# Patient Record
Sex: Male | Born: 1995 | Race: White | Hispanic: No | Marital: Single | Smoking: Never smoker
Health system: Southern US, Community
[De-identification: ages and names within clinical notes are randomized; demographics above are authoritative.]

## PROBLEM LIST (undated history)

## (undated) DIAGNOSIS — G809 Cerebral palsy, unspecified: Secondary | ICD-10-CM

## (undated) DIAGNOSIS — IMO0001 Reserved for inherently not codable concepts without codable children: Secondary | ICD-10-CM

## (undated) DIAGNOSIS — J45909 Unspecified asthma, uncomplicated: Secondary | ICD-10-CM

## (undated) DIAGNOSIS — R569 Unspecified convulsions: Secondary | ICD-10-CM

## (undated) DIAGNOSIS — F8189 Other developmental disorders of scholastic skills: Secondary | ICD-10-CM

## (undated) HISTORY — PX: TONSILLECTOMY: SUR1361

## (undated) HISTORY — PX: ADENOIDECTOMY: SUR15

## (undated) HISTORY — PX: ROOT CANAL: SHX2363

## (undated) HISTORY — PX: TYMPANOSTOMY TUBE PLACEMENT: SHX32

## (undated) HISTORY — PX: ESOPHAGOGASTRODUODENOSCOPY: SHX1529

---

## 2015-08-13 ENCOUNTER — Encounter: Payer: Self-pay | Admitting: *Deleted

## 2015-08-18 NOTE — Discharge Instructions (Signed)

## 2015-08-20 ENCOUNTER — Encounter
Admission: RE | Disposition: A | Discharge status: Home / Self Care | Payer: Self-pay | Source: Ambulatory Visit | Attending: Podiatry

## 2015-08-20 ENCOUNTER — Ambulatory Visit: Payer: BLUE CROSS/BLUE SHIELD | Admitting: Anesthesiology

## 2015-08-20 ENCOUNTER — Ambulatory Visit
Admission: RE | Admit: 2015-08-20 | Discharge: 2015-08-20 | Disposition: A | Discharge status: Home / Self Care | Payer: BLUE CROSS/BLUE SHIELD | Source: Ambulatory Visit | Attending: Podiatry | Admitting: Podiatry

## 2015-08-20 DIAGNOSIS — L600 Ingrowing nail: Secondary | ICD-10-CM | POA: Insufficient documentation

## 2015-08-20 DIAGNOSIS — Z888 Allergy status to other drugs, medicaments and biological substances status: Secondary | ICD-10-CM | POA: Insufficient documentation

## 2015-08-20 DIAGNOSIS — L03031 Cellulitis of right toe: Secondary | ICD-10-CM | POA: Diagnosis not present

## 2015-08-20 DIAGNOSIS — G809 Cerebral palsy, unspecified: Secondary | ICD-10-CM | POA: Diagnosis not present

## 2015-08-20 DIAGNOSIS — L03032 Cellulitis of left toe: Secondary | ICD-10-CM | POA: Insufficient documentation

## 2015-08-20 DIAGNOSIS — J45909 Unspecified asthma, uncomplicated: Secondary | ICD-10-CM | POA: Diagnosis not present

## 2015-08-20 HISTORY — DX: Unspecified convulsions: R56.9

## 2015-08-20 HISTORY — DX: Cerebral palsy, unspecified: G80.9

## 2015-08-20 HISTORY — DX: Reserved for inherently not codable concepts without codable children: IMO0001

## 2015-08-20 HISTORY — DX: Unspecified asthma, uncomplicated: J45.909

## 2015-08-20 HISTORY — DX: Other developmental disorders of scholastic skills: F81.89

## 2015-08-20 HISTORY — PX: TOENAIL EXCISION: SHX183

## 2015-08-20 SURGERY — MINOR TOENAIL EXCISION
Anesthesia: General | Site: Toe | Laterality: Bilateral | Wound class: Clean

## 2015-08-20 MED ORDER — HYDROMORPHONE HCL 1 MG/ML IJ SOLN: 0.2500 mg | INTRAMUSCULAR | Status: DC | PRN

## 2015-08-20 MED ORDER — BACITRACIN-NEOMYCIN-POLYMYXIN 400-5-5000 EX OINT
TOPICAL_OINTMENT | CUTANEOUS | Status: DC | PRN
Start: 2015-08-20 — End: 2015-08-20
  Administered 2015-08-20: 1 via TOPICAL

## 2015-08-20 MED ORDER — OXYCODONE HCL 5 MG/5ML PO SOLN: 5.0000 mg | Freq: Once | ORAL | Status: DC | PRN

## 2015-08-20 MED ORDER — OXYCODONE HCL 5 MG PO TABS: 5.0000 mg | ORAL_TABLET | Freq: Once | ORAL | Status: DC | PRN

## 2015-08-20 MED ORDER — PROMETHAZINE HCL 25 MG/ML IJ SOLN: 6.2500 mg | INTRAMUSCULAR | Status: DC | PRN

## 2015-08-20 MED ORDER — MEPERIDINE HCL 25 MG/ML IJ SOLN: 6.2500 mg | INTRAMUSCULAR | Status: DC | PRN

## 2015-08-20 MED ORDER — MIDAZOLAM HCL 2 MG/ML PO SYRP
20.0000 mg | ORAL_SOLUTION | Freq: Once | ORAL | Status: AC
  Administered 2015-08-20: 20 mg via ORAL

## 2015-08-20 MED ORDER — BUPIVACAINE HCL 0.5 % IJ SOLN
INTRAMUSCULAR | Status: DC | PRN
  Administered 2015-08-20: 9 mL via INTRAMUSCULAR

## 2015-08-20 SURGICAL SUPPLY — 8 items
BNDG COHESIVE 4X5 TAN STRL (GAUZE/BANDAGES/DRESSINGS) ×2 IMPLANT
COVER TABLE BACK 60X90 (DRAPES) ×2 IMPLANT
DRAIN PENROSE 1/4X12 LTX (DRAIN) ×4 IMPLANT
DRAPE SHEET LG 3/4 BI-LAMINATE (DRAPES) ×2 IMPLANT
DURAPREP 26ML APPLICATOR (WOUND CARE) ×4 IMPLANT
GAUZE SPONGE 4X4 12PLY STRL (GAUZE/BANDAGES/DRESSINGS) ×2 IMPLANT
GLOVE BIO SURGEON STRL SZ7.5 (GLOVE) ×4 IMPLANT
PACK EXTREMITY ARMC (MISCELLANEOUS) ×2 IMPLANT

## 2015-08-20 NOTE — Anesthesia Preprocedure Evaluation (Signed)
Anesthesia Evaluation  Patient identified by MRN, date of birth, ID band Patient awake    Reviewed: Allergy & Precautions, NPO status , Patient's Chart, lab work & pertinent test results, reviewed documented beta blocker date and time   Airway Mallampati: II  TM Distance: >3 FB Neck ROM: Full    Dental no notable dental hx.    Pulmonary asthma ,           Cardiovascular negative cardio ROS       Neuro/Psych Seizures -,  CP    GI/Hepatic negative GI ROS, Neg liver ROS,   Endo/Other  negative endocrine ROS  Renal/GU negative Renal ROS  negative genitourinary   Musculoskeletal negative musculoskeletal ROS (+)   Abdominal   Peds negative pediatric ROS (+)  Hematology negative hematology ROS (+)   Anesthesia Other Findings   Reproductive/Obstetrics negative OB ROS                             Anesthesia Physical Anesthesia Plan  ASA: II  Anesthesia Plan: General   Post-op Pain Management:    Induction: Inhalational  Airway Management Planned: LMA  Additional Equipment:   Intra-op Plan:   Post-operative Plan:   Informed Consent: I have reviewed the patients History and Physical, chart, labs and discussed the procedure including the risks, benefits and alternatives for the proposed anesthesia with the patient or authorized representative who has indicated his/her understanding and acceptance.   Consent reviewed with POA  Plan Discussed with: CRNA  Anesthesia Plan Comments:         Anesthesia Quick Evaluation

## 2015-08-20 NOTE — OR Nursing (Signed)
PT HAS CP. MOM VERBALIZED ID OF PT AND ALL QUESTIONS ANSWERED APPROPRIATELY.

## 2015-08-20 NOTE — Anesthesia Procedure Notes (Signed)
Procedure Name: MAC Performed by: Andee PolesBUSH, Lawayne Hartig Pre-anesthesia Checklist: Patient identified, Emergency Drugs available, Suction available, Timeout performed and Patient being monitored Patient Re-evaluated:Patient Re-evaluated prior to inductionOxygen Delivery Method: Circle system utilized Intubation Type: Inhalational induction Placement Confirmation: positive ETCO2

## 2015-08-20 NOTE — Transfer of Care (Signed)
Immediate Anesthesia Transfer of Care Note  Patient: Corey Robertson  Procedure(s) Performed: Procedure(s) with comments: MINOR TOENAIL EXCISION AND MATRIX PERMANENT BILATERAL BORDERS BILATERAL GREAT TOES  (Bilateral) - GENERAL OR IV PHENOL  Patient Location: PACU  Anesthesia Type: General  Level of Consciousness: awake, alert  and patient cooperative  Airway and Oxygen Therapy: Patient Spontanous Breathing and Patient connected to supplemental oxygen  Post-op Assessment: Post-op Vital signs reviewed, Patient's Cardiovascular Status Stable, Respiratory Function Stable, Patent Airway and No signs of Nausea or vomiting  Post-op Vital Signs: Reviewed and stable  Complications: No apparent anesthesia complications

## 2015-08-20 NOTE — Progress Notes (Signed)
Patient took liquid versed and spit it back up, MD is aware

## 2015-08-20 NOTE — Anesthesia Postprocedure Evaluation (Signed)
  Anesthesia Post-op Note  Patient: Corey Robertson  Procedure(s) Performed: Procedure(s) with comments: MINOR TOENAIL EXCISION AND MATRIX PERMANENT BILATERAL BORDERS BILATERAL GREAT TOES  (Bilateral) - GENERAL OR IV PHENOL  Anesthesia type:General  Patient location: PACU  Post pain: Pain level controlled  Post assessment: Post-op Vital signs reviewed, Patient's Cardiovascular Status Stable, Respiratory Function Stable, Patent Airway and No signs of Nausea or vomiting  Post vital signs: Reviewed and stable  Last Vitals:  Filed Vitals:   08/20/15 1251  Pulse: 74  Temp: 36.2 C  Resp: 20    Level of consciousness: awake, alert  and patient cooperative  Complications: No apparent anesthesia complications

## 2015-08-20 NOTE — H&P (Signed)
HISTORY AND PHYSICAL INTERVAL NOTE:  08/20/2015  12:54 PM  Corey Robertson  has presented today for surgery, with the diagnosis of L03.031 PARONYCHIA RIGHT GREAT TOE L03.032 PARONYCHIA LEFT GREAT TOE.  The various methods of treatment have been discussed with the patient.  No guarantees were given.  After consideration of risks, benefits and other options for treatment, the patient has consented to surgery.  I have reviewed the patients' chart and labs.    Patient Vitals for the past 24 hrs:  Temp Pulse Resp SpO2 Weight  08/20/15 1251 97.2 F (36.2 C) 74 20 97 % -  08/20/15 1120 - - - - 68.493 kg (151 lb)    A history and physical examination was performed in my office.  The patient was reexamined.  There have been no changes to this history and physical examination. A written history and physical interval note was signed prior to taking patient back to the OR.  Gwyneth RevelsFowler, Cielo Arias A

## 2015-08-20 NOTE — Op Note (Signed)
Operative note   Surgeon:Jensyn Cambria [employer] forces logistics/support/administrative officerowler    Assistant: None    Preop diagnosis: Paronychia medial and lateral nail borders right and left great toes    Postop diagnosis: Same    Procedure: Permanent matrixectomy medial and lateral nail borders right and left great toes    EBL: None    Anesthesia:local and MAC    Hemostasis: Digital Tourniquet    Specimen: None    Complications: None    Operative indications: A 19 year old with a history of cerebral palsy. Unable to tolerate the procedures and office. Presents for excision of ingrowing nails to right and left great toes.    Procedure:  Patient was brought into the OR and placed on the operating table in thesupine position. After anesthesia was obtained the bilateral great toes was prepped and draped in usual sterile fashion.  A straight was applied around the right and left great toes. Next the medial and lateral nail borders were sharply removed.  3x30 second applications of phenol were applied to the medial and lateral nail borders of the right and left great toes. The area was flushed with alcohol. Antibiotic ointment and a bandage was applied.    Patient tolerated the procedure and anesthesia well.  Was transported from the OR to the PACU with all vital signs stable and vascular status intact. To be discharged per routine protocol.  Will follow up in approximately 1 week in the outpatient clinic.

## 2015-08-21 ENCOUNTER — Encounter: Payer: Self-pay | Admitting: Podiatry

## 2016-11-27 ENCOUNTER — Inpatient Hospital Stay
Admission: EM | Admit: 2016-11-27 | Discharge: 2016-11-29 | DRG: 871 | Disposition: A | Discharge status: Home / Self Care | Payer: BLUE CROSS/BLUE SHIELD | Attending: Internal Medicine | Admitting: Internal Medicine

## 2016-11-27 ENCOUNTER — Emergency Department: Payer: BLUE CROSS/BLUE SHIELD

## 2016-11-27 ENCOUNTER — Encounter: Payer: Self-pay | Admitting: Emergency Medicine

## 2016-11-27 DIAGNOSIS — Z87820 Personal history of traumatic brain injury: Secondary | ICD-10-CM

## 2016-11-27 DIAGNOSIS — R1110 Vomiting, unspecified: Secondary | ICD-10-CM | POA: Diagnosis not present

## 2016-11-27 DIAGNOSIS — G809 Cerebral palsy, unspecified: Secondary | ICD-10-CM | POA: Diagnosis present

## 2016-11-27 DIAGNOSIS — K219 Gastro-esophageal reflux disease without esophagitis: Secondary | ICD-10-CM | POA: Diagnosis present

## 2016-11-27 DIAGNOSIS — R112 Nausea with vomiting, unspecified: Secondary | ICD-10-CM | POA: Diagnosis present

## 2016-11-27 DIAGNOSIS — J690 Pneumonitis due to inhalation of food and vomit: Secondary | ICD-10-CM | POA: Diagnosis present

## 2016-11-27 DIAGNOSIS — A419 Sepsis, unspecified organism: Principal | ICD-10-CM | POA: Diagnosis present

## 2016-11-27 DIAGNOSIS — F8089 Other developmental disorders of speech and language: Secondary | ICD-10-CM | POA: Diagnosis present

## 2016-11-27 DIAGNOSIS — R569 Unspecified convulsions: Secondary | ICD-10-CM | POA: Diagnosis present

## 2016-11-27 DIAGNOSIS — Z881 Allergy status to other antibiotic agents status: Secondary | ICD-10-CM | POA: Diagnosis not present

## 2016-11-27 DIAGNOSIS — J45909 Unspecified asthma, uncomplicated: Secondary | ICD-10-CM | POA: Diagnosis present

## 2016-11-27 DIAGNOSIS — Z888 Allergy status to other drugs, medicaments and biological substances status: Secondary | ICD-10-CM

## 2016-11-27 LAB — COMPREHENSIVE METABOLIC PANEL
ALT: 18 U/L (ref 17–63)
ANION GAP: 8 (ref 5–15)
AST: 28 U/L (ref 15–41)
Albumin: 4.4 g/dL (ref 3.5–5.0)
Alkaline Phosphatase: 91 U/L (ref 38–126)
BUN: 14 mg/dL (ref 6–20)
CHLORIDE: 103 mmol/L (ref 101–111)
CO2: 27 mmol/L (ref 22–32)
CREATININE: 0.95 mg/dL (ref 0.61–1.24)
Calcium: 9.2 mg/dL (ref 8.9–10.3)
Glucose, Bld: 145 mg/dL — ABNORMAL HIGH (ref 65–99)
POTASSIUM: 3.6 mmol/L (ref 3.5–5.1)
SODIUM: 138 mmol/L (ref 135–145)
Total Bilirubin: 0.4 mg/dL (ref 0.3–1.2)
Total Protein: 7.6 g/dL (ref 6.5–8.1)

## 2016-11-27 LAB — CBC WITH DIFFERENTIAL/PLATELET
BASOS ABS: 0 10*3/uL (ref 0–0.1)
Basophils Relative: 0 %
EOS PCT: 0 %
Eosinophils Absolute: 0 10*3/uL (ref 0–0.7)
HCT: 42.3 % (ref 40.0–52.0)
HEMOGLOBIN: 14 g/dL (ref 13.0–18.0)
LYMPHS ABS: 0.7 10*3/uL — AB (ref 1.0–3.6)
Lymphocytes Relative: 9 %
MCH: 26.8 pg (ref 26.0–34.0)
MCHC: 33.1 g/dL (ref 32.0–36.0)
MCV: 81 fL (ref 80.0–100.0)
Monocytes Absolute: 0.4 10*3/uL (ref 0.2–1.0)
Monocytes Relative: 5 %
NEUTROS PCT: 86 %
Neutro Abs: 6.3 10*3/uL (ref 1.4–6.5)
PLATELETS: 255 10*3/uL (ref 150–440)
RBC: 5.22 MIL/uL (ref 4.40–5.90)
RDW: 15.9 % — ABNORMAL HIGH (ref 11.5–14.5)
WBC: 7.5 10*3/uL (ref 3.8–10.6)

## 2016-11-27 LAB — LACTIC ACID, PLASMA: LACTIC ACID, VENOUS: 1.8 mmol/L (ref 0.5–1.9)

## 2016-11-27 LAB — TROPONIN I

## 2016-11-27 MED ORDER — SODIUM CHLORIDE 0.9 % IV BOLUS (SEPSIS)
250.0000 mL | Freq: Once | INTRAVENOUS | Status: AC
  Administered 2016-11-28: 250 mL via INTRAVENOUS

## 2016-11-27 MED ORDER — VANCOMYCIN HCL IN DEXTROSE 1-5 GM/200ML-% IV SOLN
1000.0000 mg | Freq: Once | INTRAVENOUS | Status: AC
Start: 2016-11-27 — End: 2016-11-28
  Administered 2016-11-27: 1000 mg via INTRAVENOUS
  Filled 2016-11-27: qty 200

## 2016-11-27 MED ORDER — PIPERACILLIN-TAZOBACTAM 3.375 G IVPB 30 MIN
3.3750 g | Freq: Once | INTRAVENOUS | Status: AC
Start: 2016-11-27 — End: 2016-11-27
  Administered 2016-11-27: 3.375 g via INTRAVENOUS
  Filled 2016-11-27: qty 50

## 2016-11-27 MED ORDER — SODIUM CHLORIDE 0.9 % IV BOLUS (SEPSIS)
1000.0000 mL | Freq: Once | INTRAVENOUS | Status: AC
  Administered 2016-11-27: 1000 mL via INTRAVENOUS

## 2016-11-27 MED ORDER — ACETAMINOPHEN 160 MG/5ML PO SOLN
650.0000 mg | Freq: Once | ORAL | Status: DC
  Filled 2016-11-27: qty 20.3

## 2016-11-27 MED ORDER — PIPERACILLIN-TAZOBACTAM 3.375 G IVPB
3.3750 g | Freq: Three times a day (TID) | INTRAVENOUS | Status: DC
  Administered 2016-11-28 – 2016-11-29 (×4): 3.375 g via INTRAVENOUS
  Filled 2016-11-27 (×4): qty 50

## 2016-11-27 MED ORDER — VANCOMYCIN HCL IN DEXTROSE 1-5 GM/200ML-% IV SOLN
1000.0000 mg | Freq: Three times a day (TID) | INTRAVENOUS | Status: DC
  Administered 2016-11-28 (×2): 1000 mg via INTRAVENOUS
  Filled 2016-11-27 (×3): qty 200

## 2016-11-27 NOTE — Progress Notes (Signed)
Pharmacy Antibiotic Note  Corey Robertson is a 21 y.o. male admitted on 11/27/2016 with sepsis.  Pharmacy has been consulted for vancomycin and Zosyn dosing.  Plan: DW 68.5kg  Vd 48L kei 0.094 hr-1  T1/2 7 hours Vancomycin 1 gram q 8 hours ordered with stacked dosing. Level before 5th dose. Goal trough 15-20  Zosyn 3.375 grams q 8 hours ordered.  Height: 5\' 5"  (165.1 cm) Weight: 151 lb (68.5 kg) IBW/kg (Calculated) : 61.5  Temp (24hrs), Avg:102 F (38.9 C), Min:102 F (38.9 C), Max:102 F (38.9 C)   Recent Labs Lab 11/27/16 2242  WBC 7.5  CREATININE 0.95  LATICACIDVEN 1.8    Estimated Creatinine Clearance: 107.9 mL/min (by C-G formula based on SCr of 0.95 mg/dL).    Allergies  Allergen Reactions  . Omeprazole Diarrhea    Antimicrobials this admission: Vancomycin, Zosyn  2/10 >>    >>   Dose adjustments this admission:   Microbiology results: 2/10 BCx: pending 2/10 UCx: pending      2/10 CXR: basilar opacities L>R 2/10 UA: pending  Thank you for allowing pharmacy to be a part of this patient's care.  Tyrrell Stephens S 11/27/2016 11:44 PM

## 2016-11-27 NOTE — H&P (Addendum)
[facility] [place] [facility]   PATIENT NAME: Corey Robertson    MR#:  [id number]  DATE OF BIRTH:  [birth year]  DATE OF ADMISSION:  11/27/2016  PRIMARY CARE PHYSICIAN: Dione Housekeeperlmedo, Mario Ernesto, MD   REQUESTING/REFERRING PHYSICIAN: Manson PasseyBrown, MD  CHIEF COMPLAINT:   Chief Complaint  Patient presents with  . Emesis  . Influenza    HISTORY OF PRESENT ILLNESS:  Corey Lindauheodore Botero  is a 21 y.o. male who presents with Cough, vomiting. Patient has a history of childhood TB and is unable to contribute to his own history of present illness. History is given by his mother who is at bedside. Patient has had a couple days of mild cough, and today started having some vomiting. After that he developed chills and fever. Here in the ED he was found to meet sepsis criteria with tachycardia, tachypnea, and bandemia on his differential. Hospitalists were called for admission  PAST MEDICAL HISTORY:   Past Medical History:  Diagnosis Date  . Cerebral palsy (HCC)    Left arm and leg most effected  . Developmental non-verbal disorder   . Reactive airway disease    with bad colds  . Seizures (HCC)    none in 12 yrs. controlled on carbamazepine  . Shortness of breath dyspnea     PAST SURGICAL HISTORY:   Past Surgical History:  Procedure Laterality Date  . ADENOIDECTOMY    . ESOPHAGOGASTRODUODENOSCOPY    . ROOT CANAL    . TOENAIL EXCISION Bilateral 08/20/2015   Procedure: MINOR TOENAIL EXCISION AND MATRIX PERMANENT BILATERAL BORDERS BILATERAL GREAT TOES ;  Surgeon: Gwyneth RevelsJustin Fowler, DPM;  [facility];  Service: Podiatry;  Laterality: Bilateral;  PHENOL PENROSE DRAINS X 2  USED AS TOURNIQUETS.Marland Kitchen.Marland Kitchen.APPLIED AT 1230 AND OFF AT 1245  . TONSILLECTOMY    . TYMPANOSTOMY TUBE PLACEMENT     x7    SOCIAL HISTORY:   Social History  Substance Use Topics  . Smoking status: Never Smoker  . Smokeless tobacco: Never Used  . Alcohol use No    FAMILY HISTORY:   Family  History  Problem Relation Age of Onset  . Hypertension Mother   . Hypertension Maternal Grandfather     DRUG ALLERGIES:   Allergies  Allergen Reactions  . Omeprazole Diarrhea    MEDICATIONS AT HOME:   Prior to Admission medications   Medication Sig Start Date End Date Taking? Authorizing Provider  albuterol (PROVENTIL HFA;VENTOLIN HFA) 108 (90 BASE) MCG/ACT inhaler Inhale 2 puffs into the lungs every 6 (six) hours as needed for wheezing or shortness of breath.    Historical Provider, MD  budesonide (PULMICORT) 0.5 MG/2ML nebulizer solution Take 0.5 mg by nebulization 2 (two) times daily as needed.    Historical Provider, MD  carbamazepine (CARBATROL) 200 MG 12 hr capsule Take 200 mg by mouth 2 (two) times daily. 2 caps in AM. 3 caps in PM.    Historical Provider, MD  Nutritional Supplements (BOOST PLUS PO) Take 8 oz by mouth 3 (three) times daily.    Historical Provider, MD  ranitidine (ZANTAC) 150 MG capsule Take 150 mg by mouth 2 (two) times daily.    Historical Provider, MD    REVIEW OF SYSTEMS:  Review of Systems  Unable to perform ROS: Mental acuity     VITAL SIGNS:   Vitals:   11/27/16 2231 11/27/16 2232  BP: (!) 92/50   Pulse: (!) 151   Resp: (!) 24   Temp: (!) 102  F (38.9 C)   TempSrc: Oral   SpO2: 93%   Weight:  68.5 kg (151 lb)  Height:  5\' 5"  (1.651 m)   Wt Readings from Last 3 Encounters:  11/27/16 68.5 kg (151 lb)  08/20/15 68.5 kg (151 lb) (46 %, Z= -0.09)*   * Growth percentiles are based on CDC 2-20 Years data.    PHYSICAL EXAMINATION:  Physical Exam  Vitals reviewed. Constitutional: He is oriented to person, place, and time. He appears well-developed and well-nourished. No distress.  HENT:  Head: Normocephalic and atraumatic.  Mouth/Throat: Oropharynx is clear and moist.  Eyes: Conjunctivae and EOM are normal. Pupils are equal, round, and reactive to light. No scleral icterus.  Neck: Normal range of motion. Neck supple. No JVD present. No  thyromegaly present.  Cardiovascular: Normal rate, regular rhythm and intact distal pulses.  Exam reveals no gallop and no friction rub.   No murmur heard. Respiratory: Effort normal. No respiratory distress. He has no wheezes. He has rales (bibasilar).  GI: Soft. Bowel sounds are normal. He exhibits no distension. There is no tenderness.  Musculoskeletal: Normal range of motion. He exhibits no edema.  No arthritis, no gout  Lymphadenopathy:    He has no cervical adenopathy.  Neurological: He is alert and oriented to person, place, and time. No cranial nerve deficit.  No dysarthria, no aphasia  Skin: Skin is warm and dry. No rash noted. No erythema.  Psychiatric: He has a normal mood and affect. His behavior is normal. Judgment and thought content normal.    LABORATORY PANEL:   CBC  Recent Labs Lab 11/27/16 2242  WBC 7.5  HGB 14.0  HCT 42.3  PLT 255   ------------------------------------------------------------------------------------------------------------------  Chemistries   Recent Labs Lab 11/27/16 2242  NA 138  K 3.6  CL 103  CO2 27  GLUCOSE 145*  BUN 14  CREATININE 0.95  CALCIUM 9.2  AST 28  ALT 18  ALKPHOS 91  BILITOT 0.4   ------------------------------------------------------------------------------------------------------------------  Cardiac Enzymes  Recent Labs Lab 11/27/16 2242  TROPONINI <0.03   ------------------------------------------------------------------------------------------------------------------  RADIOLOGY:  Dg Chest Port 1 View  Result Date: 11/27/2016 CLINICAL DATA:  21 y/o  M; fevers and chills. EXAM: PORTABLE CHEST 1 VIEW COMPARISON:  None. FINDINGS: Normal heart size. Ill-defined opacities in the lung bases greater on the left. No pleural effusion. No pneumothorax. Bones are unremarkable. Gastric distention of the stomach. IMPRESSION: Ill-defined opacities in the lung bases greater on the left suspicious for pneumonia.  Gastric distention of the stomach. These results were called by telephone at the time of interpretation on 11/27/2016 at 11:36 pm to Dr. Daryel November , who verbally acknowledged these results. Electronically Signed   By: Mitzi Hansen M.D.   On: 11/27/2016 23:37    EKG:   Orders placed or performed during the hospital encounter of 11/27/16  . ED EKG 12-Lead  . ED EKG  . ED EKG 12-Lead  . ED EKG  . EKG 12-Lead  . EKG 12-Lead  . EKG 12-Lead  . EKG 12-Lead    IMPRESSION AND PLAN:  Principal Problem:   Sepsis (HCC) - broad spectrum IV antibiotics started in the ED and continued on admission. Cultures sent from the ED. Lactic acid was within normal limits, patient is hemodynamically stable after IV fluid administration. 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 Management plans discussed with the patient and/or family.  DVT PROPHYLAXIS: SubQ lovenox  GI PROPHYLAXIS: H2 blocker  ADMISSION STATUS: Inpatient  CODE STATUS: Full Code Status History    This patient does not have a recorded code status. Please follow your organizational policy for patients in this situation.      TOTAL TIME TAKING CARE OF THIS PATIENT: 45 minutes.    Anihya Tuma FIELDING 11/27/2016, 11:57 PM  [facility]  Office  [phone]  CC: Primary care physician; Dione Housekeeper, MD

## 2016-11-27 NOTE — ED Notes (Signed)
Patients mother reports pt had fever (103.1), chills, cough, nasal congestion. Pt has hx of respiratory issues (pneumonia).

## 2016-11-27 NOTE — ED Triage Notes (Signed)
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 Pt has hx of seizures and mom is concerned that she will not be able to get medicine into him tonight. Pt has vomited once before arriving to ED and has flu like symptoms. Pt is in NAD at this time.

## 2016-11-28 ENCOUNTER — Inpatient Hospital Stay: Payer: BLUE CROSS/BLUE SHIELD

## 2016-11-28 LAB — BASIC METABOLIC PANEL
Anion gap: 6 (ref 5–15)
BUN: 13 mg/dL (ref 6–20)
CO2: 23 mmol/L (ref 22–32)
Calcium: 8.5 mg/dL — ABNORMAL LOW (ref 8.9–10.3)
Chloride: 109 mmol/L (ref 101–111)
Creatinine, Ser: 0.84 mg/dL (ref 0.61–1.24)
GFR calc Af Amer: >60 mL/min (ref >60)
Glucose, Bld: 120 mg/dL — ABNORMAL HIGH (ref 65–99)
Potassium: 3.7 mmol/L (ref 3.5–5.1)
SODIUM: 138 mmol/L (ref 135–145)

## 2016-11-28 LAB — CBC
HCT: 37.8 % — ABNORMAL LOW (ref 40.0–52.0)
Hemoglobin: 12.6 g/dL — ABNORMAL LOW (ref 13.0–18.0)
MCH: 27.1 pg (ref 26.0–34.0)
MCHC: 33.4 g/dL (ref 32.0–36.0)
MCV: 81.2 fL (ref 80.0–100.0)
PLATELETS: 240 10*3/uL (ref 150–440)
RBC: 4.66 MIL/uL (ref 4.40–5.90)
RDW: 15.7 % — AB (ref 11.5–14.5)
WBC: 10.3 10*3/uL (ref 3.8–10.6)

## 2016-11-28 LAB — BLOOD GAS, VENOUS
ACID-BASE EXCESS: 3.7 mmol/L — AB (ref 0.0–2.0)
BICARBONATE: 29.2 mmol/L — AB (ref 20.0–28.0)
PATIENT TEMPERATURE: 37
PH VEN: 7.41 (ref 7.250–7.430)
pCO2, Ven: 46 mmHg (ref 44.0–60.0)

## 2016-11-28 LAB — LACTIC ACID, PLASMA: LACTIC ACID, VENOUS: 1.9 mmol/L (ref 0.5–1.9)

## 2016-11-28 LAB — INFLUENZA PANEL BY PCR (TYPE A & B)
INFLAPCR: NEGATIVE
INFLBPCR: NEGATIVE

## 2016-11-28 MED ORDER — CARBAMAZEPINE 200 MG PO TABS
400.0000 mg | ORAL_TABLET | ORAL | Status: DC
  Administered 2016-11-28 – 2016-11-29 (×2): 400 mg via ORAL
  Filled 2016-11-28 (×2): qty 2

## 2016-11-28 MED ORDER — ENOXAPARIN SODIUM 40 MG/0.4ML ~~LOC~~ SOLN
40.0000 mg | SUBCUTANEOUS | Status: DC
  Filled 2016-11-28: qty 0.4

## 2016-11-28 MED ORDER — ESOMEPRAZOLE MAGNESIUM 40 MG PO PACK: 40.0000 mg | PACK | Freq: Two times a day (BID) | ORAL | Status: DC

## 2016-11-28 MED ORDER — CARBAMAZEPINE ER 200 MG PO CP12: 200.0000 mg | ORAL_CAPSULE | Freq: Two times a day (BID) | ORAL | Status: DC

## 2016-11-28 MED ORDER — ACETAMINOPHEN 325 MG PO TABS: 650.0000 mg | ORAL_TABLET | Freq: Four times a day (QID) | ORAL | Status: DC | PRN

## 2016-11-28 MED ORDER — CARBAMAZEPINE 200 MG PO TABS
600.0000 mg | ORAL_TABLET | Freq: Every evening | ORAL | Status: DC
  Administered 2016-11-28: 600 mg via ORAL
  Filled 2016-11-28: qty 3

## 2016-11-28 MED ORDER — ONDANSETRON HCL 4 MG PO TABS: 4.0000 mg | ORAL_TABLET | Freq: Four times a day (QID) | ORAL | Status: DC | PRN

## 2016-11-28 MED ORDER — ONDANSETRON HCL 4 MG/2ML IJ SOLN: 4.0000 mg | Freq: Four times a day (QID) | INTRAMUSCULAR | Status: DC | PRN

## 2016-11-28 MED ORDER — CARBAMAZEPINE ER 200 MG PO TB12
600.0000 mg | ORAL_TABLET | Freq: Every day | ORAL | Status: DC
  Filled 2016-11-28: qty 1

## 2016-11-28 MED ORDER — ACETAMINOPHEN 650 MG RE SUPP: 650.0000 mg | Freq: Four times a day (QID) | RECTAL | Status: DC | PRN

## 2016-11-28 MED ORDER — CARBAMAZEPINE ER 400 MG PO TB12
400.0000 mg | ORAL_TABLET | Freq: Every day | ORAL | Status: DC
  Filled 2016-11-28: qty 1

## 2016-11-28 MED ORDER — ONDANSETRON HCL 4 MG/2ML IJ SOLN
INTRAMUSCULAR | Status: AC
  Filled 2016-11-28: qty 2

## 2016-11-28 MED ORDER — ACETAMINOPHEN 10 MG/ML IV SOLN
500.0000 mg | Freq: Four times a day (QID) | INTRAVENOUS | Status: AC
  Administered 2016-11-28 (×4): 500 mg via INTRAVENOUS
  Filled 2016-11-28 (×4): qty 50

## 2016-11-28 MED ORDER — FAMOTIDINE 20 MG PO TABS: 20.0000 mg | ORAL_TABLET | Freq: Two times a day (BID) | ORAL | Status: DC

## 2016-11-28 MED ORDER — BUDESONIDE 0.5 MG/2ML IN SUSP: 0.5000 mg | Freq: Two times a day (BID) | RESPIRATORY_TRACT | Status: DC | PRN

## 2016-11-28 MED ORDER — ONDANSETRON HCL 4 MG/2ML IJ SOLN
4.0000 mg | Freq: Once | INTRAMUSCULAR | Status: AC
  Administered 2016-11-28: 4 mg via INTRAVENOUS

## 2016-11-28 MED ORDER — SODIUM CHLORIDE 0.9% FLUSH
3.0000 mL | Freq: Two times a day (BID) | INTRAVENOUS | Status: DC
  Administered 2016-11-28: 3 mL via INTRAVENOUS

## 2016-11-28 MED ORDER — ALBUTEROL SULFATE (2.5 MG/3ML) 0.083% IN NEBU: 3.0000 mL | INHALATION_SOLUTION | Freq: Four times a day (QID) | RESPIRATORY_TRACT | Status: DC | PRN

## 2016-11-28 MED ORDER — SODIUM CHLORIDE 0.9 % IV SOLN
INTRAVENOUS | Status: DC
  Administered 2016-11-28: 02:00:00 via INTRAVENOUS

## 2016-11-28 MED ORDER — SODIUM CHLORIDE 0.9 % IV SOLN
INTRAVENOUS | Status: AC
  Administered 2016-11-28 – 2016-11-29 (×3): via INTRAVENOUS

## 2016-11-28 MED ORDER — ESOMEPRAZOLE MAGNESIUM 40 MG PO PACK
40.0000 mg | PACK | Freq: Two times a day (BID) | ORAL | Status: DC
  Administered 2016-11-28: 40 mg via ORAL
  Filled 2016-11-28: qty 40

## 2016-11-28 NOTE — Progress Notes (Signed)
Pt's mother concerned about his choices for food while on a dysphagia diet. She states that most of the food on this diet is dry and difficult for the pt to chew, and that dietary would not allow him to order pancakes or a muffin on this diet. She is requesting to change his diet to something with less restrictions so that he can have more choices. She is agreeable to helping him eat and making sure his food is chopped up enough for him to tolerate. Dr. Elpidio AnisSudini notified, per his verbal order will change pt to a regular diet.Pt's family is aware that by changing his diet to regular he should have a family member or staff member present to help him eat at all times to ensure his safety (as there is a question about whether or not his pneumonia has been caused by aspiration). Having the family present would be preferable as they are his regular care takers and know what foods he can and cannot tolerate. They are agreeable to ordering all of his food for him and understand that if he is not monitored while eating he may be an aspiration risk. Pt's mother states that she will be here to assist in feeding the patient for all his meals.   Additionally, pt's mother brought home his own supply of nexium packets. Per Dr. Elpidio AnisSudini, we may discontinue pepcid and order pt to use his at-home nexium powder supply.

## 2016-11-28 NOTE — Progress Notes (Signed)
When bathing pt I noted that he has developed 3 hives on the anterior aspect of his R wrist where his IV antibiotics are infusing. Pt denies discomfort. IV stopped and flushed with normal saline, and blood return noted after flush. VSS: BP 138/88 (BP Location: Right Arm)   Pulse (!) 112   Temp 99.5 F (37.5 C) (Axillary)   Resp 18   Ht 5\' 5"  (1.651 m)   Wt 66 kg (145 lb 6.4 oz)   SpO2 98%   BMI 24.20 kg/m  And pt appears to be in no acute distress. Dr. Elpidio AnisSudini notified. Per his order, will stop the IV vanc, but continue IV zosyn.

## 2016-11-28 NOTE — ED Notes (Signed)
Patient has small emesis. Pt's O2 dropped to 83% on RA. Pt placed on 2L [place] per MD Willis.  Pt's O2 increased to 94%. RN will continue to monitor

## 2016-11-28 NOTE — ED Notes (Signed)
MD Willis at bedside. 

## 2016-11-28 NOTE — ED Provider Notes (Signed)
[facility] Emergency Department Provider Note   First MD Initiated Contact with Patient 11/27/16 2301     (approximate)  I have reviewed the triage vital signs and the nursing notes.  History obtained from the patient's mother HISTORY  Chief Complaint Emesis and Influenza    HPI Corey Robertson is a 21 y.o. male with below list of chronic medical conditions presents with cough fever and vomiting today. Patient's temperature arrival to the emergency department 102.   Past Medical History:  Diagnosis Date  . Cerebral palsy (HCC)    Left arm and leg most effected  . Developmental non-verbal disorder   . Reactive airway disease    with bad colds  . Seizures (HCC)    none in 12 yrs. controlled on carbamazepine  . Shortness of breath dyspnea     Patient Active Problem List   Diagnosis Date Noted  . Aspiration pneumonia (HCC) 11/27/2016  . Sepsis (HCC) 11/27/2016  . GERD (gastroesophageal reflux disease) 11/27/2016  . History of traumatic brain injury 11/27/2016  . Nausea & vomiting 11/27/2016    Past Surgical History:  Procedure Laterality Date  . ADENOIDECTOMY    . ESOPHAGOGASTRODUODENOSCOPY    . ROOT CANAL    . TOENAIL EXCISION Bilateral 08/20/2015   Procedure: MINOR TOENAIL EXCISION AND MATRIX PERMANENT BILATERAL BORDERS BILATERAL GREAT TOES ;  Surgeon: Gwyneth Revels, DPM;  Location: [facility];  Service: Podiatry;  Laterality: Bilateral;  PHENOL PENROSE DRAINS X 2  USED AS TOURNIQUETS.Marland KitchenMarland KitchenAPPLIED AT 1230 AND OFF AT 1245  . TONSILLECTOMY    . TYMPANOSTOMY TUBE PLACEMENT     x7    Prior to Admission medications   Medication Sig Start Date End Date Taking? Authorizing Provider  albuterol (PROVENTIL HFA;VENTOLIN HFA) 108 (90 BASE) MCG/ACT inhaler Inhale 2 puffs into the lungs every 6 (six) hours as needed for wheezing or shortness of breath.    Historical Provider, MD  budesonide (PULMICORT) 0.5 MG/2ML nebulizer solution Take 0.5 mg by  nebulization 2 (two) times daily as needed.    Historical Provider, MD  carbamazepine (CARBATROL) 200 MG 12 hr capsule Take 200 mg by mouth 2 (two) times daily. 2 caps in AM. 3 caps in PM.    Historical Provider, MD  Nutritional Supplements (BOOST PLUS PO) Take 8 oz by mouth 3 (three) times daily.    Historical Provider, MD  ranitidine (ZANTAC) 150 MG capsule Take 150 mg by mouth 2 (two) times daily.    Historical Provider, MD    Allergies Omeprazole  Family History  Problem Relation Age of Onset  . Hypertension Mother   . Hypertension Maternal Grandfather     Social History Social History  Substance Use Topics  . Smoking status: Never Smoker  . Smokeless tobacco: Never Used  . Alcohol use No    Review of Systems Constitutional: No fever/chills Eyes: No visual changes. ENT: No sore throat. Cardiovascular: Denies chest pain. Respiratory: Denies shortness of breath. Gastrointestinal: No abdominal pain.  No nausea, Positive for vomiting Genitourinary: Negative for dysuria. Musculoskeletal: Negative for back pain. Skin: Negative for rash. Neurological: Negative for headaches, focal weakness or numbness.  10-point ROS otherwise negative.  ____________________________________________   PHYSICAL EXAM:  VITAL SIGNS: ED Triage Vitals  Enc Vitals Group     BP 11/27/16 2231 (!) 92/50     Pulse Rate 11/27/16 2231 (!) 151     Resp 11/27/16 2231 (!) 24     Temp 11/27/16 2231 (!) 102 F (  38.9 C)     Temp Source 11/27/16 2231 Oral     SpO2 11/27/16 2231 93 %     Weight 11/27/16 2232 151 lb (68.5 kg)     Height 11/27/16 2232 5\' 5"  (1.651 m)     Head Circumference --      Peak Flow --      Pain Score --      Pain Loc --      Pain Edu? --      Excl. in GC? --     Constitutional: Alert and oriented. Well appearing and in no acute distress. Eyes: Conjunctivae are normal. PERRL. EOMI. Head: Atraumatic. Mouth/Throat: Mucous membranes are moist. Neck: No stridor.     Cardiovascular: Normal rate, regular rhythm. Good peripheral circulation. Grossly normal heart sounds. Respiratory: Normal respiratory effort.  No retractions. Bibasilar rhonchi Gastrointestinal: Soft and nontender. No distention.  Musculoskeletal: No lower extremity tenderness nor edema. No gross acute deformities of extremities. Skin:  Skin is warm, dry and intact. No rash noted.   ____________________________________________   LABS (all labs ordered are listed, but only abnormal results are displayed)  Labs Reviewed  COMPREHENSIVE METABOLIC PANEL - Abnormal; Notable for the following:       Result Value   Glucose, Bld 145 (*)    All other components within normal limits  CBC WITH DIFFERENTIAL/PLATELET - Abnormal; Notable for the following:    RDW 15.9 (*)    Lymphs Abs 0.7 (*)    All other components within normal limits  BLOOD GAS, VENOUS - Abnormal; Notable for the following:    Bicarbonate 29.2 (*)    Acid-Base Excess 3.7 (*)    All other components within normal limits  CULTURE, BLOOD (ROUTINE X 2)  CULTURE, BLOOD (ROUTINE X 2)  URINE CULTURE  LACTIC ACID, PLASMA  TROPONIN I  INFLUENZA PANEL BY PCR (TYPE A & B)  LACTIC ACID, PLASMA  URINALYSIS, COMPLETE (UACMP) WITH MICROSCOPIC    RADIOLOGY I, [place] Kairos Panetta, personally viewed and evaluated these images (plain radiographs) as part of my medical decision making, as well as reviewing the written report by the radiologist.  Dg Chest Port 1 View  Result Date: 11/27/2016 CLINICAL DATA:  21 y/o  M; fevers and chills. EXAM: PORTABLE CHEST 1 VIEW COMPARISON:  None. FINDINGS: Normal heart size. Ill-defined opacities in the lung bases greater on the left. No pleural effusion. No pneumothorax. Bones are unremarkable. Gastric distention of the stomach. IMPRESSION: Ill-defined opacities in the lung bases greater on the left suspicious for pneumonia. Gastric distention of the stomach. These results were called by telephone at the  time of interpretation on 11/27/2016 at 11:36 pm to Dr. Daryel November , who verbally acknowledged these results. Electronically Signed   By: Mitzi Hansen M.D.   On: 11/27/2016 23:37      Procedures  Critical care: CRITICAL CARE Performed by: Corey Robertson   Total critical care time: 40 minutes  Critical care time was exclusive of separately billable procedures and treating other patients.  Critical care was necessary to treat or prevent imminent or life-threatening deterioration.  Critical care was time spent personally by me on the following activities: development of treatment plan with patient and/or surrogate as well as nursing, discussions with consultants, evaluation of patient's response to treatment, examination of patient, obtaining history from patient or surrogate, ordering and performing treatments and interventions, ordering and review of laboratory studies, ordering and review of radiographic studies, pulse oximetry and re-evaluation of  patient's condition.   INITIAL IMPRESSION / ASSESSMENT AND PLAN / ED COURSE  Pertinent labs & imaging results that were available during my care of the patient were reviewed by me and considered in my medical decision making (see chart for details).  History of physical exam concern for possible pneumonia versus influenza. Given history possibility of aspiration pneumonia. Patient presents to emergency department tachycardic hypotensive and febrile illness such concern for possible sepsis. Patient discussed with Dr. Anne HahnWillis for hospital admission for further evaluation and management      ____________________________________________  FINAL CLINICAL IMPRESSION(S) / ED DIAGNOSES  Final diagnoses:  Sepsis, due to unspecified organism [facility](HCC)     MEDICATIONS GIVEN DURING THIS VISIT:  Medications  sodium chloride 0.9 % bolus 1,000 mL (1,000 mLs Intravenous New Bag/Given 11/27/16 2302)    And  sodium chloride 0.9 %  bolus 1,000 mL (1,000 mLs Intravenous New Bag/Given 11/27/16 2310)    And  sodium chloride 0.9 % bolus 250 mL (not administered)  piperacillin-tazobactam (ZOSYN) IVPB 3.375 g (not administered)  vancomycin (VANCOCIN) IVPB 1000 mg/200 mL premix (not administered)  acetaminophen (TYLENOL) solution 650 mg (not administered)  piperacillin-tazobactam (ZOSYN) IVPB 3.375 g (3.375 g Intravenous New Bag/Given 11/27/16 2302)  vancomycin (VANCOCIN) IVPB 1000 mg/200 mL premix (1,000 mg Intravenous New Bag/Given 11/27/16 2305)     NEW OUTPATIENT MEDICATIONS STARTED DURING THIS VISIT:  New Prescriptions   No medications on file    Modified Medications   No medications on file    Discontinued Medications   No medications on file     Note:  This document was prepared using Dragon voice recognition software and may include unintentional dictation errors.    Corey Currentandolph N Carisma Troupe, MD 11/28/16 [id number]

## 2016-11-28 NOTE — Progress Notes (Signed)
Per micromedex pt's acetaminophen is compatible with both IV van and IV zosyn. Pharmacist called to verify that it is compatible. Per pharmacist IV Zenaida NieceVan is compatible with IV acetaminophen AND our IV zosyn.

## 2016-11-28 NOTE — Progress Notes (Signed)
[facility] - [place] at [facility]   PATIENT NAME: Corey Robertson    MR#:  [id number]  DATE OF BIRTH:  1996-07-09  SUBJECTIVE:  CHIEF COMPLAINT:   Chief Complaint  Patient presents with  . Emesis  . Influenza  Febrile.  Not on O2  History obtained from mother at bedside  REVIEW OF SYSTEMS:    Review of Systems  Unable to perform ROS: Mental acuity    DRUG ALLERGIES:   Allergies  Allergen Reactions  . Azithromycin     Interferes with antiepileptics  . Omeprazole Diarrhea    VITALS:  Blood pressure 126/75, pulse (!) 106, temperature 100.2 F (37.9 C), temperature source Axillary, resp. rate 18, height 5\' 5"  (1.651 m), weight 66 kg (145 lb 6.4 oz), SpO2 94 %.  PHYSICAL EXAMINATION:   Physical Exam  GENERAL:  21 y.o.-year-old patient lying in the bed with no acute distress.  EYES: Pupils equal, round, reactive to light and accommodation. No scleral icterus. Extraocular muscles intact.  HEENT: Head atraumatic, normocephalic. Oropharynx and nasopharynx clear.  NECK:  Supple, no jugular venous distention. No thyroid enlargement, no tenderness.  LUNGS: Normal breath sounds bilaterally, no wheezing, rales, rhonchi. No use of accessory muscles of respiration.  CARDIOVASCULAR: S1, S2 normal. No murmurs, rubs, or gallops.  ABDOMEN: Soft, nontender, nondistended. Bowel sounds present. No organomegaly or mass.  EXTREMITIES: No cyanosis, clubbing or edema b/l.    NEUROLOGIC: Moves all 4 extremities Left contractures PSYCHIATRIC: The patient is alert and awake. Pleasant. Non verbal  LABORATORY PANEL:   CBC  Recent Labs Lab 11/28/16 0319  WBC 10.3  HGB 12.6*  HCT 37.8*  PLT 240   ------------------------------------------------------------------------------------------------------------------ Chemistries   Recent Labs Lab 11/27/16 2242 11/28/16 0319  NA 138 138  K 3.6 3.7  CL 103 109  CO2 27 23  GLUCOSE 145* 120*  BUN 14 13  CREATININE  0.95 0.84  CALCIUM 9.2 8.5*  AST 28  --   ALT 18  --   ALKPHOS 91  --   BILITOT 0.4  --    ------------------------------------------------------------------------------------------------------------------  Cardiac Enzymes  Recent Labs Lab 11/27/16 2242  TROPONINI <0.03   ------------------------------------------------------------------------------------------------------------------  RADIOLOGY:  Dg Chest Port 1 View  Result Date: 11/27/2016 CLINICAL DATA:  21 y/o  M; fevers and chills. EXAM: PORTABLE CHEST 1 VIEW COMPARISON:  None. FINDINGS: Normal heart size. Ill-defined opacities in the lung bases greater on the left. No pleural effusion. No pneumothorax. Bones are unremarkable. Gastric distention of the stomach. IMPRESSION: Ill-defined opacities in the lung bases greater on the left suspicious for pneumonia. Gastric distention of the stomach. These results were called by telephone at the time of interpretation on 11/27/2016 at 11:36 pm to Dr. Daryel NovemberJONATHAN WILLIAMS , who verbally acknowledged these results. Electronically Signed   By: Mitzi HansenLance  Furusawa-Stratton M.D.   On: 11/27/2016 23:37     ASSESSMENT AND PLAN:   * Aspiration pneumonia with sepsis ON IVF, IV abx. Not on O2. Still febrile. Blood cx pending Speech eval  * Seizures On tegretaol  * Cerebral palsy  All the records are reviewed and case discussed with Care Management/Social Workerr. Management plans discussed with the patient, family and they are in agreement.  CODE STATUS: FULL CODE  DVT Prophylaxis: SCDs  TOTAL TIME TAKING CARE OF THIS PATIENT: 35 minutes.   POSSIBLE D/C IN 1-2 DAYS, DEPENDING ON CLINICAL CONDITION.  Milagros LollSudini, Jasmon Graffam R M.D on 11/28/2016 at 11:38 AM  Between 7am to 6pm - Pager -  [phone]  After 6pm go to [email or web address] - password [email or web address] [facility]  [facility] [place] [facility]  Office  [phone]  CC: Primary care physician; Dione Housekeeper, MD  Note: This dictation was prepared  with Dragon dictation along with smaller phrase technology. Any transcriptional errors that result from this process are unintentional.

## 2016-11-28 NOTE — ED Notes (Signed)
Attempted to call report on patient. Was informed RN unavailable and would call back

## 2016-11-29 LAB — VANCOMYCIN, TROUGH: Vancomycin Tr: 7 ug/mL — ABNORMAL LOW (ref 15–20)

## 2016-11-29 MED ORDER — AMOXICILLIN-POT CLAVULANATE 250-62.5 MG/5ML PO SUSR: 10.0000 mL | Freq: Two times a day (BID) | ORAL | 0 refills | Status: DC

## 2016-11-29 NOTE — Discharge Summary (Signed)
[facility] - [place] at [facility]   PATIENT NAME: Corey Robertson    MR#:  [id number]  DATE OF BIRTH:  Mar 13, 1996  DATE OF ADMISSION:  11/27/2016 ADMITTING PHYSICIAN: Oralia Manisavid Willis, MD  DATE OF DISCHARGE: 11/29/2016 11:18 AM  PRIMARY CARE PHYSICIAN: Dione Housekeeperlmedo, Mario Ernesto, MD   ADMISSION DIAGNOSIS:  Sepsis, due to unspecified organism (HCC) [A41.9]  DISCHARGE DIAGNOSIS:  Principal Problem:   Sepsis (HCC) Active Problems:   Aspiration pneumonia (HCC)   GERD (gastroesophageal reflux disease)   History of traumatic brain injury   Nausea & vomiting   SECONDARY DIAGNOSIS:   Past Medical History:  Diagnosis Date  . Cerebral palsy (HCC)    Left arm and leg most effected  . Developmental non-verbal disorder   . Reactive airway disease    with bad colds  . Seizures (HCC)    none in 12 yrs. controlled on carbamazepine  . Shortness of breath dyspnea      ADMITTING HISTORY  HISTORY OF PRESENT ILLNESS:  Corey Robertson  is a 21 y.o. male who presents with Cough, vomiting. Patient has a history of childhood TB and is unable to contribute to his own history of present illness. History is given by his mother who is at bedside. Patient has had a couple days of mild cough, and today started having some vomiting. After that he developed chills and fever. Here in the ED he was found to meet sepsis criteria with tachycardia, tachypnea, and bandemia on his differential. Hospitalists were called for admission   HOSPITAL COURSE:   * Aspiration pneumonia with sepsis Treated with aggressive IV fluid resuscitation and IV antibiotics. Patient improved well and by day of discharge he was afebrile. Sepsis resolved. Not needing oxygen. He is being transitioned to Augmentin for 5 more days after discharge orally. Aspiration precautions after discharge. Cultures negative  * Seizures On tegretaol  * Cerebral palsy  Stable for discharge home  CONSULTS OBTAINED:    DRUG  ALLERGIES:   Allergies  Allergen Reactions  . Azithromycin     Interferes with antiepileptics  . Omeprazole Diarrhea    DISCHARGE MEDICATIONS:   Discharge Medication List as of 11/29/2016 10:08 AM    START taking these medications   Details  amoxicillin-clavulanate (AUGMENTIN) 250-62.5 MG/5ML suspension Take 10 mLs by mouth 2 (two) times daily., Starting Mon 11/29/2016, Normal      CONTINUE these medications which have NOT CHANGED   Details  albuterol (PROVENTIL HFA;VENTOLIN HFA) 108 (90 BASE) MCG/ACT inhaler Inhale 2 puffs into the lungs every 6 (six) hours as needed for wheezing or shortness of breath., Historical Med    budesonide (PULMICORT) 0.5 MG/2ML nebulizer solution Take 0.5 mg by nebulization 2 (two) times daily as needed., Historical Med    carbamazepine (CARBATROL) 200 MG 12 hr capsule Take 200 mg by mouth 2 (two) times daily. 2 caps in AM. 3 caps in PM., Historical Med    Multiple Vitamins-Minerals (MULTIVITAMIN WITH IRON-MINERALS) liquid Take 5 mLs by mouth daily., Historical Med    NEXIUM 40 MG packet Take 40 mg by mouth 2 (two) times daily., Starting Wed 11/10/2016, Historical Med    Nutritional Supplements (BOOST PLUS PO) Take 8 oz by mouth 3 (three) times daily., Historical Med       Today   VITAL SIGNS:  Blood pressure (!) 130/91, pulse 91, temperature 98.8 F (37.1 C), temperature source Oral, resp. rate 16, height 5\' 5"  (1.651 m), weight 66 kg (145 lb 6.4 oz), SpO2  98 %.  I/O:   Intake/Output Summary (Last 24 hours) at 11/29/16 1509 Last data filed at 11/29/16 0300  Gross per 24 hour  Intake             2720 ml  Output                0 ml  Net             2720 ml    PHYSICAL EXAMINATION:  Physical Exam  GENERAL:  21 y.o.-year-old patient lying in the bed with no acute distress.  LUNGS: Normal breath sounds bilaterally, no wheezing, rales,rhonchi or crepitation. No use of accessory muscles of respiration.  CARDIOVASCULAR: S1, S2 normal. No  murmurs, rubs, or gallops.  ABDOMEN: Soft, non-tender, non-distended. Bowel sounds present. No organomegaly or mass.  NEUROLOGIC: Moves all 4 extremities. Contractures PSYCHIATRIC: The patient is awake and alert. Non verbal.  DATA REVIEW:   CBC  Recent Labs Lab 11/28/16 0319  WBC 10.3  HGB 12.6*  HCT 37.8*  PLT 240   Chemistries   Recent Labs Lab 11/27/16 2242 11/28/16 0319  NA 138 138  K 3.6 3.7  CL 103 109  CO2 27 23  GLUCOSE 145* 120*  BUN 14 13  CREATININE 0.95 0.84  CALCIUM 9.2 8.5*  AST 28  --   ALT 18  --   ALKPHOS 91  --   BILITOT 0.4  --     Cardiac Enzymes  Recent Labs Lab 11/27/16 2242  TROPONINI <0.03    Microbiology Results  Results for orders placed or performed during the hospital encounter of 11/27/16  Blood Culture (routine x 2)     Status: None (Preliminary result)   Collection Time: 11/27/16 10:41 PM  Result Value Ref Range Status   Specimen Description BLOOD RIGHT HAND  Final   Special Requests   Final    BOTTLES DRAWN AEROBIC AND ANAEROBIC [id number]   Culture NO GROWTH 2 DAYS  Final   Report Status PENDING  Incomplete  Blood Culture (routine x 2)     Status: None (Preliminary result)   Collection Time: 11/27/16 10:42 PM  Result Value Ref Range Status   Specimen Description BLOOD RIGHT ANTECUBITAL  Final   Special Requests BOTTLES DRAWN AEROBIC AND ANAEROBIC BCHV  Final   Culture NO GROWTH 2 DAYS  Final   Report Status PENDING  Incomplete    RADIOLOGY:  Dg Abd 1 View  Result Date: 11/28/2016 CLINICAL DATA:  Pneumonia,  vomiting EXAM: ABDOMEN - 1 VIEW COMPARISON:  11/27/2016 FINDINGS: No dilated loops of large or small bowel. There is gas-filled loops of small bowel. There is moderate volume stool in the rectum. No pathologic calcifications. No organomegaly. IMPRESSION: No evidence bowel obstruction. Electronically Signed   By: Genevive Bi M.D.   On: 11/28/2016 12:11   Dg Chest Port 1 View  Result Date:  11/27/2016 CLINICAL DATA:  21 y/o  M; fevers and chills. EXAM: PORTABLE CHEST 1 VIEW COMPARISON:  None. FINDINGS: Normal heart size. Ill-defined opacities in the lung bases greater on the left. No pleural effusion. No pneumothorax. Bones are unremarkable. Gastric distention of the stomach. IMPRESSION: Ill-defined opacities in the lung bases greater on the left suspicious for pneumonia. Gastric distention of the stomach. These results were called by telephone at the time of interpretation on 11/27/2016 at 11:36 pm to Dr. Daryel November , who verbally acknowledged these results. Electronically Signed   By: Mitzi Hansen M.D.   On:  11/27/2016 23:37   Follow up with PCP in 1 week.  Management plans discussed with the patient, family and they are in agreement.  CODE STATUS:  Code Status History    Date Active Date Inactive Code Status Order ID Comments User Context   11/28/2016  1:57 AM 11/29/2016  2:23 PM Full Code [id number]  Oralia Manis, MD ED     TOTAL TIME TAKING CARE OF THIS PATIENT ON DAY OF DISCHARGE: more than 30 minutes.   Milagros Loll R M.D on 11/29/2016 at 3:09 PM  Between 7am to 6pm - Pager - [phone]  After 6pm go to [email or web address] - password [email or web address] [facility]  [facility] [place] [facility]  Office  [phone]  CC: Primary care physician; Dione Housekeeper, MD  Note: This dictation was prepared with Dragon dictation along with smaller phrase technology. Any transcriptional errors that result from this process are unintentional.

## 2016-11-29 NOTE — Progress Notes (Signed)
SLP Cancellation Note  Patient Details Name: Corey Robertson MRN: [name] DOB: 01/09/1996   Cancelled treatment:       Reason Eval/Treat Not Completed: SLP screened, no needs identified, will sign off (chart reviewed; consulted pt's Mother, pt). Discussed pt's swallowing baseline/presentation w/ Mother. Mother indicated pt does have to "be careful" when drinking and eating and this is baseline for pt since birth d/t cerebral palsy. Mother denied any chronic episodes of pulmonary changes or decline(pneumonia) in the past few years. Pt does have a baseline of Reflux and is on Nexium 1x daily. Mother described a consistent diet that pt follows at home - she denied any overt s/s of aspiration w/ the oral diet at home.  Discussed Reflux and Aspiration precautions w/ Mother, pt; gave handouts. Mother denied need for immediate swallow evaluation; she will monitor pt's status w/ po's and f/u w/ primary MD if any concerns. NSG to reconsult if any change in status b/f discharge today.     Jerilynn SomKatherine Watson, MS, CCC-SLP Watson,Katherine 11/29/2016, 11:12 AM

## 2016-11-29 NOTE — Progress Notes (Signed)
Pharmacy Antibiotic Note  Corey Robertson is a 21 y.o. male admitted on 11/27/2016 with sepsis.  Pharmacy has been consulted for vancomycin and Zosyn dosing.  Plan: DW 68.5kg  Vd 48L kei 0.094 hr-1  T1/2 7 hours Vancomycin 1 gram q 8 hours ordered with stacked dosing. Level before 5th dose. Goal trough 15-20  Zosyn 3.375 grams q 8 hours ordered.  Height: 5\' 5"  (165.1 cm) Weight: 145 lb 6.4 oz (66 kg) IBW/kg (Calculated) : 61.5  Temp (24hrs), Avg:99.4 F (37.4 C), Min:98.7 F (37.1 C), Max:100.2 F (37.9 C)   Recent Labs Lab 11/27/16 2242 11/28/16 0319 11/29/16 0531  WBC 7.5 10.3  --   CREATININE 0.95 0.84  --   LATICACIDVEN 1.8 1.9  --   VANCOTROUGH  --   --  7*    Estimated Creatinine Clearance: 122 mL/min (by C-G formula based on SCr of 0.84 mg/dL).    Allergies  Allergen Reactions  . Azithromycin     Interferes with antiepileptics  . Omeprazole Diarrhea    Antimicrobials this admission: Vancomycin, Zosyn  2/10 >>    >>   Dose adjustments this admission: 2/12 AM vanc level 7. Vanc d/c yesterday but level was not d/c. Called lab to ask to credit back unneeded level to patient.  Microbiology results: 2/10 BCx: pending 2/10 UCx: pending      2/10 CXR: basilar opacities L>R 2/10 UA: pending  Thank you for allowing pharmacy to be a part of this patient's care.  Taisa Deloria S 11/29/2016 6:40 AM

## 2016-11-29 NOTE — Discharge Instructions (Signed)
Resume diet and activity as before ° ° °

## 2016-11-29 NOTE — Progress Notes (Signed)
Patient was discharged home with mother. IV removed with cath intact. Pharmacy med returned to patient. Reviewed discharge instructions, including meds and last dose given. Allowed time for questions.

## 2016-12-02 LAB — CULTURE, BLOOD (ROUTINE X 2)
CULTURE: NO GROWTH
CULTURE: NO GROWTH

## 2017-08-14 ENCOUNTER — Ambulatory Visit
Admission: EM | Admit: 2017-08-14 | Discharge: 2017-08-14 | Disposition: A | Discharge status: Home / Self Care | Payer: Medicaid Other | Attending: Family Medicine | Admitting: Family Medicine

## 2017-08-14 DIAGNOSIS — J029 Acute pharyngitis, unspecified: Secondary | ICD-10-CM | POA: Diagnosis not present

## 2017-08-14 DIAGNOSIS — Z20818 Contact with and (suspected) exposure to other bacterial communicable diseases: Secondary | ICD-10-CM

## 2017-08-14 LAB — RAPID STREP SCREEN (MED CTR MEBANE ONLY): Streptococcus, Group A Screen (Direct): NEGATIVE

## 2017-08-14 MED ORDER — AMOXICILLIN 400 MG/5ML PO SUSR: 1000.0000 mg | Freq: Two times a day (BID) | ORAL | 0 refills | Status: AC

## 2017-08-14 NOTE — ED Provider Notes (Signed)
[facility] ____________________________________________  Time seen: Approximately 2:34 PM  I have reviewed the triage vital signs and the nursing notes.   HISTORY  Chief Complaint Sore Throat  Historian: Mother.  Somewhat from patient as patient signs with mother some.  HPI Corey Robertson is a 21 y.o. male presenting with mother at bedside for evaluation of 2-3 days of overall being more irritable than normal as well as mother reports that he has been intermittently drooling more than normal.  States some nasal congestion and cough that is similar to his chronic.  Denies known fevers.  Declines report of sore throat, however reports that patient is unable to fully describe whether he is or not having a sore throat.  Mother states that he has not been eating quite as much, continues to drink fluids well.  Denies bowel habit changes.  States that she currently has strep throat and is being treated for the same.  Denies other complaints.  Denies other aggravating or alleviating factors.  Denies chest pain, shortness of breath, abdominal pain, dysuria, extremity pain, extremity swelling or rash. Denies recent sickness. Denies recent antibiotic use.    Past Medical History:  Diagnosis Date  . Cerebral palsy (HCC)    Left arm and leg most effected  . Developmental non-verbal disorder   . Reactive airway disease    with bad colds  . Seizures (HCC)    none in 12 yrs. controlled on carbamazepine  . Shortness of breath dyspnea     Patient Active Problem List   Diagnosis Date Noted  . Aspiration pneumonia (HCC) 11/27/2016  . Sepsis (HCC) 11/27/2016  . GERD (gastroesophageal reflux disease) 11/27/2016  . History of traumatic brain injury 11/27/2016  . Nausea & vomiting 11/27/2016    Past Surgical History:  Procedure Laterality Date  . ADENOIDECTOMY    . ESOPHAGOGASTRODUODENOSCOPY    . ROOT CANAL    . TOENAIL EXCISION Bilateral 08/20/2015   Procedure: MINOR TOENAIL  EXCISION AND MATRIX PERMANENT BILATERAL BORDERS BILATERAL GREAT TOES ;  Surgeon: Gwyneth RevelsJustin Fowler, DPM;  [facility];  Service: Podiatry;  Laterality: Bilateral;  PHENOL PENROSE DRAINS X 2  USED AS TOURNIQUETS.Marland Kitchen.Marland Kitchen.APPLIED AT 1230 AND OFF AT 1245  . TONSILLECTOMY    . TYMPANOSTOMY TUBE PLACEMENT     x7     No current facility-administered medications for this encounter.   Current Outpatient Prescriptions:  .  albuterol (PROVENTIL HFA;VENTOLIN HFA) 108 (90 BASE) MCG/ACT inhaler, Inhale 2 puffs into the lungs every 6 (six) hours as needed for wheezing or shortness of breath., Disp: , Rfl:  .  budesonide (PULMICORT) 0.5 MG/2ML nebulizer solution, Take 0.5 mg by nebulization 2 (two) times daily as needed., Disp: , Rfl:  .  carbamazepine (CARBATROL) 200 MG 12 hr capsule, Take 200 mg by mouth 2 (two) times daily. 2 caps in AM. 3 caps in PM., Disp: , Rfl:  .  Multiple Vitamins-Minerals (MULTIVITAMIN WITH IRON-MINERALS) liquid, Take 5 mLs by mouth daily., Disp: , Rfl:  .  NEXIUM 40 MG packet, Take 40 mg by mouth 2 (two) times daily., Disp: , Rfl: 12 .  Nutritional Supplements (BOOST PLUS PO), Take 8 oz by mouth 3 (three) times daily., Disp: , Rfl:  .  amoxicillin (AMOXIL) 400 MG/5ML suspension, Take 12.5 mLs (1,000 mg total) by mouth 2 (two) times daily., Disp: 250 mL, Rfl: 0 .  amoxicillin-clavulanate (AUGMENTIN) 250-62.5 MG/5ML suspension, Take 10 mLs by mouth 2 (two) times daily., Disp: 100 mL, Rfl: 0  Allergies Azithromycin and Omeprazole  Family History  Problem Relation Age of Onset  . Hypertension Mother   . Hypertension Maternal Grandfather     Social History Social History  Substance Use Topics  . Smoking status: Never Smoker  . Smokeless tobacco: Never Used  . Alcohol use No    Review of Systems per mother Constitutional: No fever/chills ENT: As above.  Cardiovascular: Denies chest pain. Respiratory: Denies shortness of breath. Gastrointestinal: No abdominal  pain.  No nausea, no vomiting.  No diarrhea.   Genitourinary: Negative for dysuria. Musculoskeletal: Negative for back pain. Skin: Negative for rash.   ____________________________________________   PHYSICAL EXAM:  VITAL SIGNS: ED Triage Vitals  Enc Vitals Group     BP 08/14/17 1415 121/76     Pulse Rate 08/14/17 1415 (!) 114     Resp 08/14/17 1415 18     Temp 08/14/17 1415 98.3 F (36.8 C)     Temp Source 08/14/17 1415 Axillary     SpO2 08/14/17 1415 100 %     Weight 08/14/17 1414 137 lb (62.1 kg)     Height --      Head Circumference --      Peak Flow --      Pain Score --      Pain Loc --      Pain Edu? --      Excl. in GC? --     Constitutional: Alert and oriented. Well appearing and in no acute distress. Eyes: Conjunctivae are normal.  Head: Atraumatic. No sinus tenderness to palpation. No swelling. No erythema.  Ears: no erythema, normal TMs bilaterally.   Nose:No nasal congestion  Mouth/Throat: Mucous membranes are moist. Mild to moderate pharyngeal erythema. No tonsillar swelling or exudate.  Neck: No stridor.  No cervical spine tenderness to palpation. Hematological/Lymphatic/Immunilogical: Mild anterior bilateral cervical lymphadenopathy. Cardiovascular: Normal rate, regular rhythm. Grossly normal heart sounds.  Good peripheral circulation. Respiratory: Normal respiratory effort.  No retractions. No wheezes, rales or rhonchi. Good air movement.  Gastrointestinal: Soft and nontender.  Musculoskeletal: Ambulatory with steady gait. No cervical, thoracic or lumbar tenderness to palpation. Neurologic:  Normal speech and language. No gait instability. Skin:  Skin appears warm, dry and intact. No rash noted. Psychiatric: Mood and affect are normal. Speech and behavior are normal.  ___________________________________________   LABS (all labs ordered are listed, but only abnormal results are displayed)  Labs Reviewed  RAPID STREP SCREEN (NOT AT [facility])  CULTURE,  GROUP A STREP [facility])    PROCEDURES Procedures    INITIAL IMPRESSION / ASSESSMENT AND PLAN / ED COURSE  Pertinent labs & imaging results that were available during my care of the patient were reviewed by me and considered in my medical decision making (see chart for details).  Well-appearing patient.  Mother at bedside.  Patient nonverbal, but does sign some with mother and did communicate with myself nonverbally.,  Mother currently with strep, patient with moderate pharyngeal erythema and some behavioral changes suggestive of sore throat.  Concern for streptococcal pharyngitis and will empirically start patient on oral amoxicillin.  Quick strep was negative however will await culture.  Encourage fluids, close monitoring and supportive care.Discussed indication, risks and benefits of medications with patient.  Discussed follow up with Primary care physician this week. Discussed follow up and return parameters including no resolution or any worsening concerns. Patient verbalized understanding and agreed to plan.   ____________________________________________   FINAL CLINICAL IMPRESSION(S) / ED DIAGNOSES  Final diagnoses:  Pharyngitis,  unspecified etiology  Strep throat exposure     Discharge Medication List as of 08/14/2017  2:53 PM    START taking these medications   Details  amoxicillin (AMOXIL) 400 MG/5ML suspension Take 12.5 mLs (1,000 mg total) by mouth 2 (two) times daily., Starting Sun 08/14/2017, Until Wed 08/24/2017, Normal        Note: This dictation was prepared with Dragon dictation along with smaller phrase technology. Any transcriptional errors that result from this process are unintentional.         Renford Dills, NP 08/14/17 1840

## 2017-08-14 NOTE — ED Triage Notes (Signed)
Patient mother brings patient in today. Patient mother states that she is positive for Strep and she is concerned that patient is also positive. Patient states that patient has been grouchy and agitated. Patient mother reports that patient has cognitive deficits.

## 2017-08-14 NOTE — Discharge Instructions (Signed)
Take medication as prescribed. Rest. Drink plenty of fluids.  ° °Follow up with your primary care physician this week as needed. Return to Urgent care for new or worsening concerns.  ° °

## 2017-08-17 LAB — CULTURE, GROUP A STREP (THRC)

## 2017-09-12 DIAGNOSIS — R569 Unspecified convulsions: Secondary | ICD-10-CM | POA: Insufficient documentation

## 2018-01-27 IMAGING — DX DG ABDOMEN 1V
1 series · 1 of 1 positions shown · non-contrast
Comparison: 11/27/2016

CLINICAL DATA: Pneumonia,  vomiting

EXAM:
ABDOMEN - 1 VIEW

[abdomen kub]
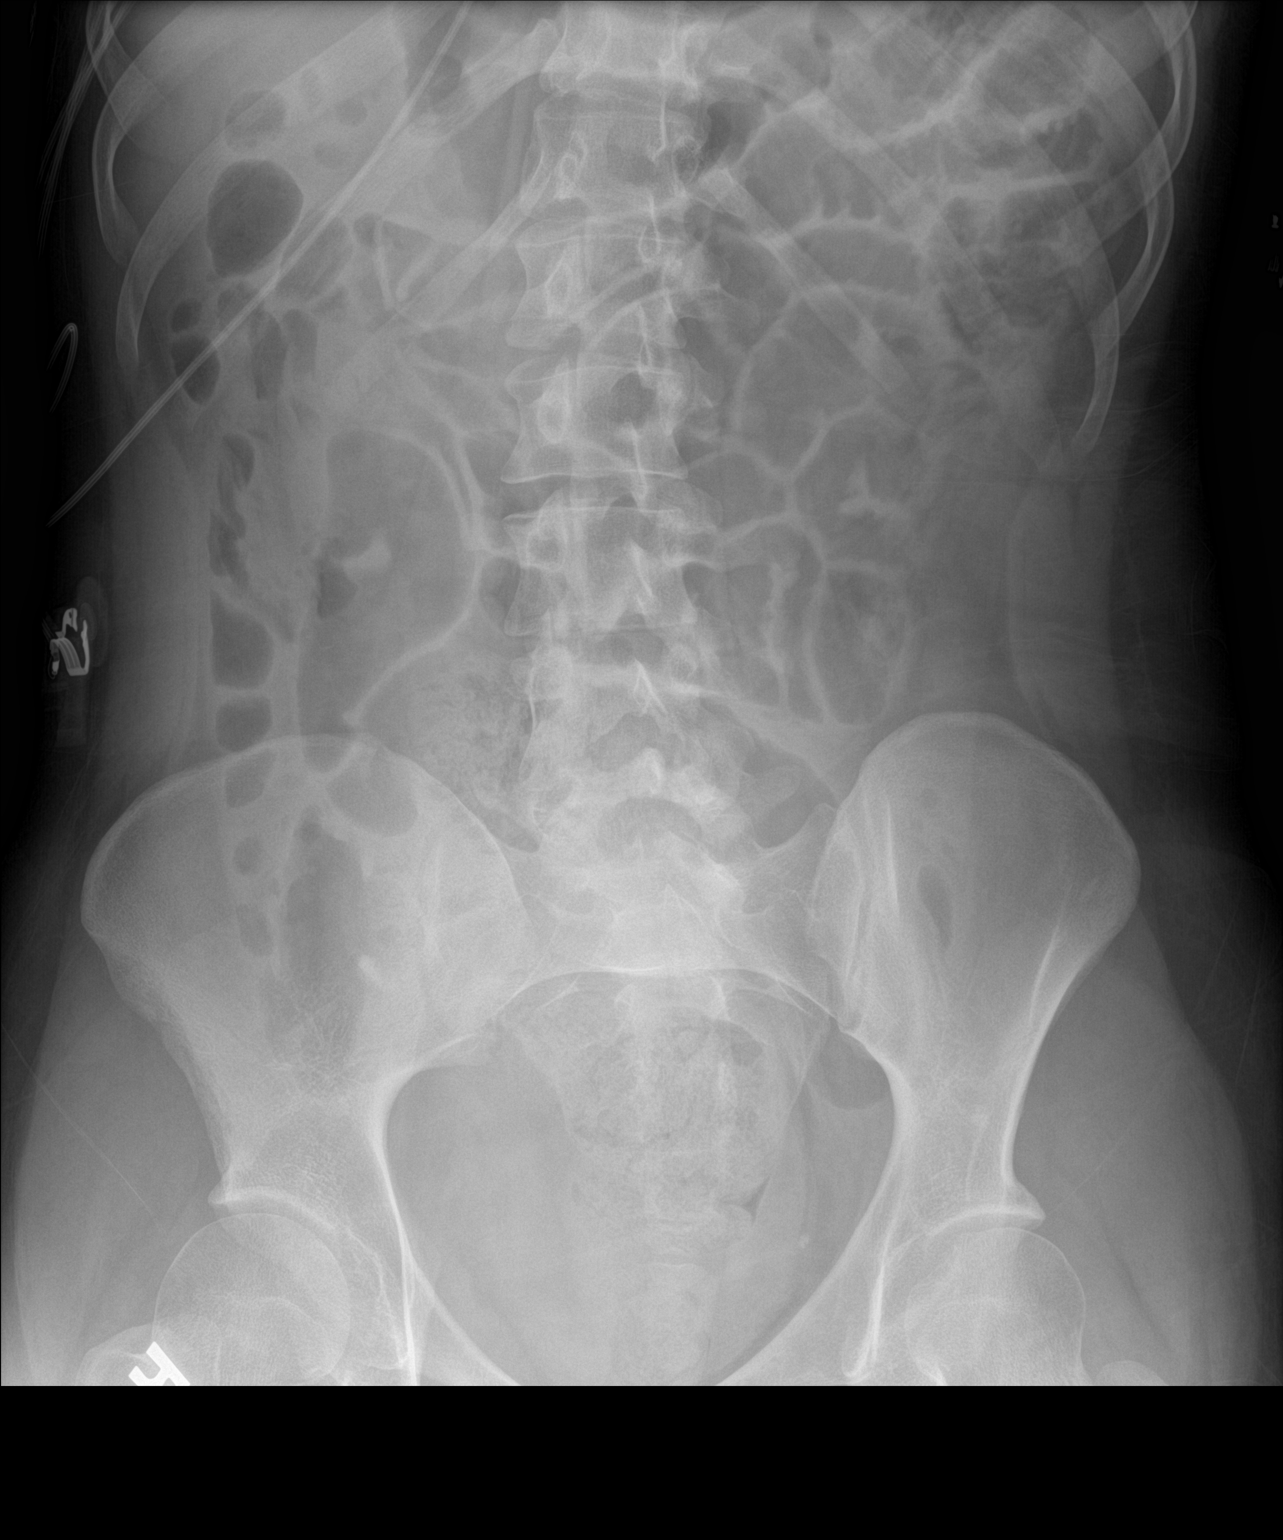

[1 of 1 positions shown; findings below may reference images not displayed]

FINDINGS: No dilated loops of large or small bowel. There is gas-filled loops
of small bowel. There is moderate volume stool in the rectum. No
pathologic calcifications. No organomegaly.
IMPRESSION: No evidence bowel obstruction.

## 2018-03-18 ENCOUNTER — Ambulatory Visit
Admission: EM | Admit: 2018-03-18 | Discharge: 2018-03-18 | Disposition: A | Discharge status: Home / Self Care | Payer: BLUE CROSS/BLUE SHIELD | Attending: Emergency Medicine | Admitting: Emergency Medicine

## 2018-03-18 ENCOUNTER — Encounter: Payer: Self-pay | Admitting: Gynecology

## 2018-03-18 ENCOUNTER — Other Ambulatory Visit: Payer: Self-pay

## 2018-03-18 ENCOUNTER — Ambulatory Visit (INDEPENDENT_AMBULATORY_CARE_PROVIDER_SITE_OTHER): Payer: BLUE CROSS/BLUE SHIELD

## 2018-03-18 DIAGNOSIS — R05 Cough: Secondary | ICD-10-CM | POA: Diagnosis not present

## 2018-03-18 DIAGNOSIS — J020 Streptococcal pharyngitis: Secondary | ICD-10-CM | POA: Diagnosis not present

## 2018-03-18 DIAGNOSIS — R059 Cough, unspecified: Secondary | ICD-10-CM

## 2018-03-18 LAB — RAPID STREP SCREEN (MED CTR MEBANE ONLY): Streptococcus, Group A Screen (Direct): POSITIVE — AB

## 2018-03-18 MED ORDER — PENICILLIN G BENZATHINE 1200000 UNIT/2ML IM SUSP
1.2000 10*6.[IU] | Freq: Once | INTRAMUSCULAR | Status: AC
  Administered 2018-03-18: 1.2 10*6.[IU] via INTRAMUSCULAR

## 2018-03-18 MED ORDER — BENZONATATE 200 MG PO CAPS: ORAL_CAPSULE | ORAL | 0 refills | Status: DC

## 2018-03-18 NOTE — ED Triage Notes (Signed)
Per mom with cough/ greenish mucous / nasal drip and congestion.

## 2018-03-18 NOTE — ED Provider Notes (Signed)
[facility]    CSN: [id number] Arrival date & time: 03/18/18  1100     History   Chief Complaint Chief Complaint  Patient presents with  . Cough    HPI Corey Robertson is a 22 y.o. male.   HPI  22 year old male with a cerebral palsy and nonverbal developmental disorder has a history of seizures and reactive airway disease presents with his mom today.  For the last 2 days he has had nasal drip and congestion and a cough with the dark green mucus. O 2 sats at home have been running low at 96-97% but today is 100%.  He is afebrile today.  Does not appear in distress and does not appear toxic.  Has had sepsis in the past from aspiration pneumonia.  Mom states that he is congested and nasal area and is not acting himself.  He refused breakfast this morning which usually consist of a shake.  According to mom, he is not swallowing like he normally does  Mom has been using inhalers and nebulizers at home.     Past Medical History:  Diagnosis Date  . Cerebral palsy (HCC)    Left arm and leg most effected  . Developmental non-verbal disorder   . Reactive airway disease    with bad colds  . Seizures (HCC)    none in 12 yrs. controlled on carbamazepine  . Shortness of breath dyspnea     Patient Active Problem List   Diagnosis Date Noted  . Aspiration pneumonia (HCC) 11/27/2016  . Sepsis (HCC) 11/27/2016  . GERD (gastroesophageal reflux disease) 11/27/2016  . History of traumatic brain injury 11/27/2016  . Nausea & vomiting 11/27/2016    Past Surgical History:  Procedure Laterality Date  . ADENOIDECTOMY    . ESOPHAGOGASTRODUODENOSCOPY    . ROOT CANAL    . TOENAIL EXCISION Bilateral 08/20/2015   Procedure: MINOR TOENAIL EXCISION AND MATRIX PERMANENT BILATERAL BORDERS BILATERAL GREAT TOES ;  Surgeon: Gwyneth Revels, DPM;  Location: [facility];  Service: Podiatry;  Laterality: Bilateral;  PHENOL PENROSE DRAINS X 2  USED AS TOURNIQUETS.Marland KitchenMarland KitchenAPPLIED AT 1230 AND OFF  AT 1245  . TONSILLECTOMY    . TYMPANOSTOMY TUBE PLACEMENT     x7       Home Medications    Prior to Admission medications   Medication Sig Start Date End Date Taking? Authorizing Provider  albuterol (PROVENTIL HFA;VENTOLIN HFA) 108 (90 BASE) MCG/ACT inhaler Inhale 2 puffs into the lungs every 6 (six) hours as needed for wheezing or shortness of breath.   Yes [provider]  budesonide (PULMICORT) 0.5 MG/2ML nebulizer solution Take 0.5 mg by nebulization 2 (two) times daily as needed.   Yes [provider]  carbamazepine (CARBATROL) 200 MG 12 hr capsule Take 200 mg by mouth 2 (two) times daily. 2 caps in AM. 3 caps in PM.   Yes [provider]  Multiple Vitamins-Minerals (MULTIVITAMIN WITH IRON-MINERALS) liquid Take 5 mLs by mouth daily.   Yes [provider]  NEXIUM 40 MG packet Take 40 mg by mouth 2 (two) times daily. 11/10/16  Yes [provider]  Nutritional Supplements (BOOST PLUS PO) Take 8 oz by mouth 3 (three) times daily.   Yes [provider]  benzonatate (TESSALON) 200 MG capsule Take one cap TID PRN cough 03/18/18   Lutricia Feil, PA-C    Family History Family History  Problem Relation Age of Onset  . Hypertension Mother   . Hypertension Maternal  Grandfather     Social History Social History   Tobacco Use  . Smoking status: Never Smoker  . Smokeless tobacco: Never Used  Substance Use Topics  . Alcohol use: No  . Drug use: No     Allergies   Azithromycin and Omeprazole   Review of Systems Review of Systems  Constitutional: Positive for activity change and appetite change. Negative for chills, fatigue and fever.  HENT: Positive for congestion, postnasal drip and rhinorrhea.   Respiratory: Positive for cough and shortness of breath.   All other systems reviewed and are negative.    Physical Exam Triage Vital Signs ED Triage Vitals  Enc Vitals Group     BP --      Pulse Rate 03/18/18 1114 64      Resp 03/18/18 1112 16     Temp 03/18/18 1114 98.3 F (36.8 C)     Temp Source 03/18/18 1112 Oral     SpO2 03/18/18 1114 100 %     Weight 03/18/18 1111 142 lb (64.4 kg)     Height --      Head Circumference --      Peak Flow --      Pain Score 03/18/18 1110 3     Pain Loc --      Pain Edu? --      Excl. in GC? --    No data found.  Updated Vital Signs Pulse 64   Temp 98.3 F (36.8 C) (Oral)   Resp 16   Wt 142 lb (64.4 kg)   SpO2 100%   BMI 23.63 kg/m   Visual Acuity Right Eye Distance:   Left Eye Distance:   Bilateral Distance:    Right Eye Near:   Left Eye Near:    Bilateral Near:     Physical Exam  Constitutional: He is oriented to person, place, and time. He appears well-developed and well-nourished. No distress.  HENT:  Head: Normocephalic.  Right Ear: External ear normal.  Left Ear: External ear normal.  Patient has bilateral tubes.  No erythema of the TMs present.  Patient would not permit examination of the mouth and throat.  Eyes: Pupils are equal, round, and reactive to light. Right eye exhibits no discharge. Left eye exhibits no discharge.  Neck: Normal range of motion.  Pulmonary/Chest: Breath sounds normal. No respiratory distress.  Would not cooperate with the deep inspiration.  There is no rales rhonchi or wheezes with a normal shallow breathing perceived.  Musculoskeletal: Normal range of motion.  Lymphadenopathy:    He has no cervical adenopathy.  Neurological: He is alert and oriented to person, place, and time.  Skin: Skin is warm and dry. He is not diaphoretic.  Nursing note and vitals reviewed.    UC Treatments / Results  Labs (all labs ordered are listed, but only abnormal results are displayed) Labs Reviewed  RAPID STREP SCREEN ([facility] ONLY) - Abnormal; Notable for the following components:      Result Value   Streptococcus, Group A Screen (Direct) POSITIVE (*)    All other components within normal limits     EKG None  Radiology Dg Chest 2 View  Result Date: 03/18/2018 CLINICAL DATA:  Cough.  Aspiration pneumonia? EXAM: CHEST - 2 VIEW COMPARISON:  Chest x-ray dated 11/27/2016. FINDINGS: Heart size and mediastinal contours are within normal limits. Lungs appear stable. No confluent opacity to suggest a developing pneumonia. No pleural effusion or pneumothorax seen. No acute or suspicious osseous  finding. IMPRESSION: No active cardiopulmonary disease. No evidence of aspiration or infectious pneumonia. Electronically Signed   By: Bary Richard M.D.   On: 03/18/2018 12:19    Procedures Procedures (including critical care time)  Medications Ordered in UC Medications  penicillin g benzathine (BICILLIN LA) 1200000 UNIT/2ML injection 1.2 Million Units (1.2 Million Units Intramuscular Given 03/18/18 1228)    Initial Impression / Assessment and Plan / UC Course  I have reviewed the triage vital signs and the nursing notes.  Pertinent labs & imaging results that were available during my care of the patient were reviewed by me and considered in my medical decision making (see chart for details).     Plan: 1. Test/x-ray results and diagnosis reviewed with patient 2. rx as per orders; risks, benefits, potential side effects reviewed with patient 3. Recommend supportive treatment with continued use of the was at home as necessary for cough and congestion.  They are opted for intramuscular LA Bicillin patient received this without difficulty.  Will provide them with the Tessalon Perles for cough particular at nighttime.  She will begin using Flonase nasal spray 2 days once daily to help with congestion.  Recommend he follow-up with his primary care physician in a week or 2. 4. F/u prn if symptoms worsen or don't improve  Final Clinical Impressions(s) / UC Diagnoses   Final diagnoses:  Streptococcal sore throat  Cough   Discharge Instructions   None    ED Prescriptions    Medication Sig  Dispense Auth. Provider   benzonatate (TESSALON) 200 MG capsule Take one cap TID PRN cough 30 capsule Lutricia Feil, PA-C     Controlled Substance Prescriptions [place] Controlled Substance Registry consulted? Not Applicable   Lutricia Feil, PA-C 03/18/18 1246

## 2018-12-17 ENCOUNTER — Ambulatory Visit
Admission: EM | Admit: 2018-12-17 | Discharge: 2018-12-17 | Disposition: A | Discharge status: Home / Self Care | Payer: BLUE CROSS/BLUE SHIELD | Attending: Family Medicine | Admitting: Family Medicine

## 2018-12-17 ENCOUNTER — Other Ambulatory Visit: Payer: Self-pay

## 2018-12-17 ENCOUNTER — Encounter: Payer: Self-pay | Admitting: Gynecology

## 2018-12-17 DIAGNOSIS — J069 Acute upper respiratory infection, unspecified: Secondary | ICD-10-CM | POA: Diagnosis not present

## 2018-12-17 DIAGNOSIS — H60501 Unspecified acute noninfective otitis externa, right ear: Secondary | ICD-10-CM | POA: Diagnosis not present

## 2018-12-17 DIAGNOSIS — H6691 Otitis media, unspecified, right ear: Secondary | ICD-10-CM | POA: Diagnosis not present

## 2018-12-17 LAB — RAPID STREP SCREEN (MED CTR MEBANE ONLY): Streptococcus, Group A Screen (Direct): NEGATIVE

## 2018-12-17 LAB — RAPID INFLUENZA A&B ANTIGENS
Influenza A (ARMC): NEGATIVE
Influenza B (ARMC): NEGATIVE

## 2018-12-17 MED ORDER — OFLOXACIN 0.3 % OT SOLN: 5.0000 [drp] | Freq: Two times a day (BID) | OTIC | 0 refills | Status: AC

## 2018-12-17 MED ORDER — AMOXICILLIN 400 MG/5ML PO SUSR: 880.0000 mg | Freq: Two times a day (BID) | ORAL | 0 refills | Status: AC

## 2018-12-17 NOTE — ED Triage Notes (Signed)
Per mom son drooping and believe that he has sore throat. Per mom notice drainage in right ear/ nasal congestion and low grade fever

## 2018-12-17 NOTE — Discharge Instructions (Addendum)
Take medication as prescribed. Rest. Drink plenty of fluids. Monitor.    Follow up with your primary care physician this week for follow up. Return to Urgent care for new or worsening concerns.

## 2018-12-17 NOTE — ED Provider Notes (Signed)
[facility] ____________________________________________  Time seen: Approximately 10:36 AM  I have reviewed the triage vital signs and the nursing notes.   HISTORY  Chief Complaint Otitis Media and Sore Throat  Historian: Mother  HPI Corey Robertson is a 23 y.o. male past medical history of cerebral palsy, nonverbal, seizures, reactive airway disease presenting with mother at bedside for evaluation of some nasal congestion, nasal drainage, right ear drainage and complaint of sore throat.  Does report he seems to be drooling more than his baseline, which mother reports this is typical for him having sore throat.  Overall continues to eat and drink well.  Denies known fevers.  No medication given today prior to arrival.  States that he does still have tubes in his ears.  Denies appearance of chest pain, shortness of breath or breathing changes.  Denies recent sickness.  Reports otherwise doing well.    Past Medical History:  Diagnosis Date  . Cerebral palsy (HCC)    Left arm and leg most effected  . Developmental non-verbal disorder   . Reactive airway disease    with bad colds  . Seizures (HCC)    none in 12 yrs. controlled on carbamazepine  . Shortness of breath dyspnea     Patient Active Problem List   Diagnosis Date Noted  . Aspiration pneumonia (HCC) 11/27/2016  . Sepsis (HCC) 11/27/2016  . GERD (gastroesophageal reflux disease) 11/27/2016  . History of traumatic brain injury 11/27/2016  . Nausea & vomiting 11/27/2016    Past Surgical History:  Procedure Laterality Date  . ADENOIDECTOMY    . ESOPHAGOGASTRODUODENOSCOPY    . ROOT CANAL    . TOENAIL EXCISION Bilateral 08/20/2015   Procedure: MINOR TOENAIL EXCISION AND MATRIX PERMANENT BILATERAL BORDERS BILATERAL GREAT TOES ;  Surgeon: Gwyneth Revels, DPM;  Location: [facility];  Service: Podiatry;  Laterality: Bilateral;  PHENOL PENROSE DRAINS X 2  USED AS TOURNIQUETS.Marland KitchenMarland KitchenAPPLIED AT 1230 AND OFF AT  1245  . TONSILLECTOMY    . TYMPANOSTOMY TUBE PLACEMENT     x7     No current facility-administered medications for this encounter.   Current Outpatient Medications:  .  albuterol (PROVENTIL HFA;VENTOLIN HFA) 108 (90 BASE) MCG/ACT inhaler, Inhale 2 puffs into the lungs every 6 (six) hours as needed for wheezing or shortness of breath., Disp: , Rfl:  .  budesonide (PULMICORT) 0.5 MG/2ML nebulizer solution, Take 0.5 mg by nebulization 2 (two) times daily as needed., Disp: , Rfl:  .  carbamazepine (CARBATROL) 200 MG 12 hr capsule, Take 200 mg by mouth 2 (two) times daily. 2 caps in AM. 3 caps in PM., Disp: , Rfl:  .  Multiple Vitamins-Minerals (MULTIVITAMIN WITH IRON-MINERALS) liquid, Take 5 mLs by mouth daily., Disp: , Rfl:  .  Nutritional Supplements (BOOST PLUS PO), Take 8 oz by mouth 3 (three) times daily., Disp: , Rfl:  .  amoxicillin (AMOXIL) 400 MG/5ML suspension, Take 11 mLs (880 mg total) by mouth 2 (two) times daily for 10 days., Disp: 220 mL, Rfl: 0 .  fluticasone (FLONASE) 50 MCG/ACT nasal spray, , Disp: , Rfl:  .  NEXIUM 40 MG packet, Take 40 mg by mouth 2 (two) times daily., Disp: , Rfl: 12 .  ofloxacin (FLOXIN) 0.3 % OTIC solution, Place 5 drops into the right ear 2 (two) times daily for 7 days., Disp: 5 mL, Rfl: 0  Allergies Azithromycin and Omeprazole  Family History  Problem Relation Age of Onset  . Hypertension Mother   .  Hypertension Maternal Grandfather     Social History Social History   Tobacco Use  . Smoking status: Never Smoker  . Smokeless tobacco: Never Used  Substance Use Topics  . Alcohol use: No  . Drug use: No    Review of Systems Constitutional: No fever ENT: As above.  Cardiovascular: Denies chest pain. Respiratory: Denies shortness of breath. Gastrointestinal: No abdominal pain.  Musculoskeletal: Negative for back pain. Skin: Negative for rash.   ____________________________________________   PHYSICAL EXAM:  VITAL SIGNS: ED Triage  Vitals  Enc Vitals Group     BP 12/17/18 0931 (!) 123/99     Pulse Rate 12/17/18 0931 (!) 109     Resp -- 18     Temp 12/17/18 0931 99.2 F (37.3 C)     Temp Source 12/17/18 0931 Temporal     SpO2 12/17/18 0931 100 %     Weight 12/17/18 0933 140 lb (63.5 kg)     Height 12/17/18 0933  (1.651 m)     Head Circumference --      Peak Flow --      Pain Score --      Pain Loc --      Pain Edu? --      Excl. in GC? --     Constitutional: Alert. Well appearing and in no acute distress. Eyes: Conjunctivae are normal. Head: Atraumatic.No swelling. No erythema.  Ears: Left: Nontender, normal canal, tube present, no erythema, otherwise normal TM.  Right: Nontender, canal mildly edematous and erythematous with drainage present, otorrhea, nonbloody, moderate erythema.  Nose:Nasal congestion with clear rhinorrhea  Mouth/Throat: Mucous membranes are moist. Mild pharyngeal erythema. No tonsillar swelling or exudate.  Neck: No stridor.  No cervical spine tenderness to palpation. Hematological/Lymphatic/Immunilogical: No cervical lymphadenopathy. Cardiovascular: Normal rate, regular rhythm. Grossly normal heart sounds.  Good peripheral circulation. Respiratory: Normal respiratory effort.  No retractions. No wheezes, rales or rhonchi. Good air movement.  Gastrointestinal: Soft and nontender.  Skin:  Skin appears warm, dry and intact. No rash noted. Psychiatric: Mood and affect are normal. ___________________________________________   LABS (all labs ordered are listed, but only abnormal results are displayed)  Labs Reviewed  RAPID STREP SCREEN ([facility] ONLY)  RAPID INFLUENZA A&B ANTIGENS ([facility] ONLY)  CULTURE, GROUP A STREP [facility])     PROCEDURES Procedures    INITIAL IMPRESSION / ASSESSMENT AND PLAN / ED COURSE  Pertinent labs & imaging results that were available during my care of the patient were reviewed by me and considered in my medical decision making (see chart for  details).  Overall well-appearing patient.  Mother at bedside.  Patient with right otitis and otorrhea.  Mother does state that patient is difficult to get eardrops in.  Will treat with ofloxacin.  Due to erythema as well and pharyngitis complaints and patient history, will empirically treat with oral amoxicillin.  Encourage fluids, supportive care and very close monitoring.  Discussed her follow-up and return parameters.Discussed indication, risks and benefits of medications with mother.  Discussed follow up with Primary care physician this week. Discussed follow up and return parameters including no resolution or any worsening concerns. Mother verbalized understanding and agreed to plan.   ____________________________________________   FINAL CLINICAL IMPRESSION(S) / ED DIAGNOSES  Final diagnoses:  Right otitis media, unspecified otitis media type  Acute otitis externa of right ear, unspecified type  Upper respiratory tract infection, unspecified type     ED Discharge Orders  Ordered    amoxicillin (AMOXIL) 400 MG/5ML suspension  2 times daily     12/17/18 1040    ofloxacin (FLOXIN) 0.3 % OTIC solution  2 times daily     12/17/18 1040           Note: This dictation was prepared with Dragon dictation along with smaller phrase technology. Any transcriptional errors that result from this process are unintentional.         Renford Dills, NP 12/17/18 1450

## 2018-12-20 LAB — CULTURE, GROUP A STREP (THRC)

## 2019-05-17 IMAGING — CR DG CHEST 2V
2 series · 2 of 2 positions shown · non-contrast
Comparison: Chest x-ray dated 11/27/2016.

CLINICAL DATA: Cough.  Aspiration pneumonia?

EXAM:
CHEST - 2 VIEW

[chest lat]
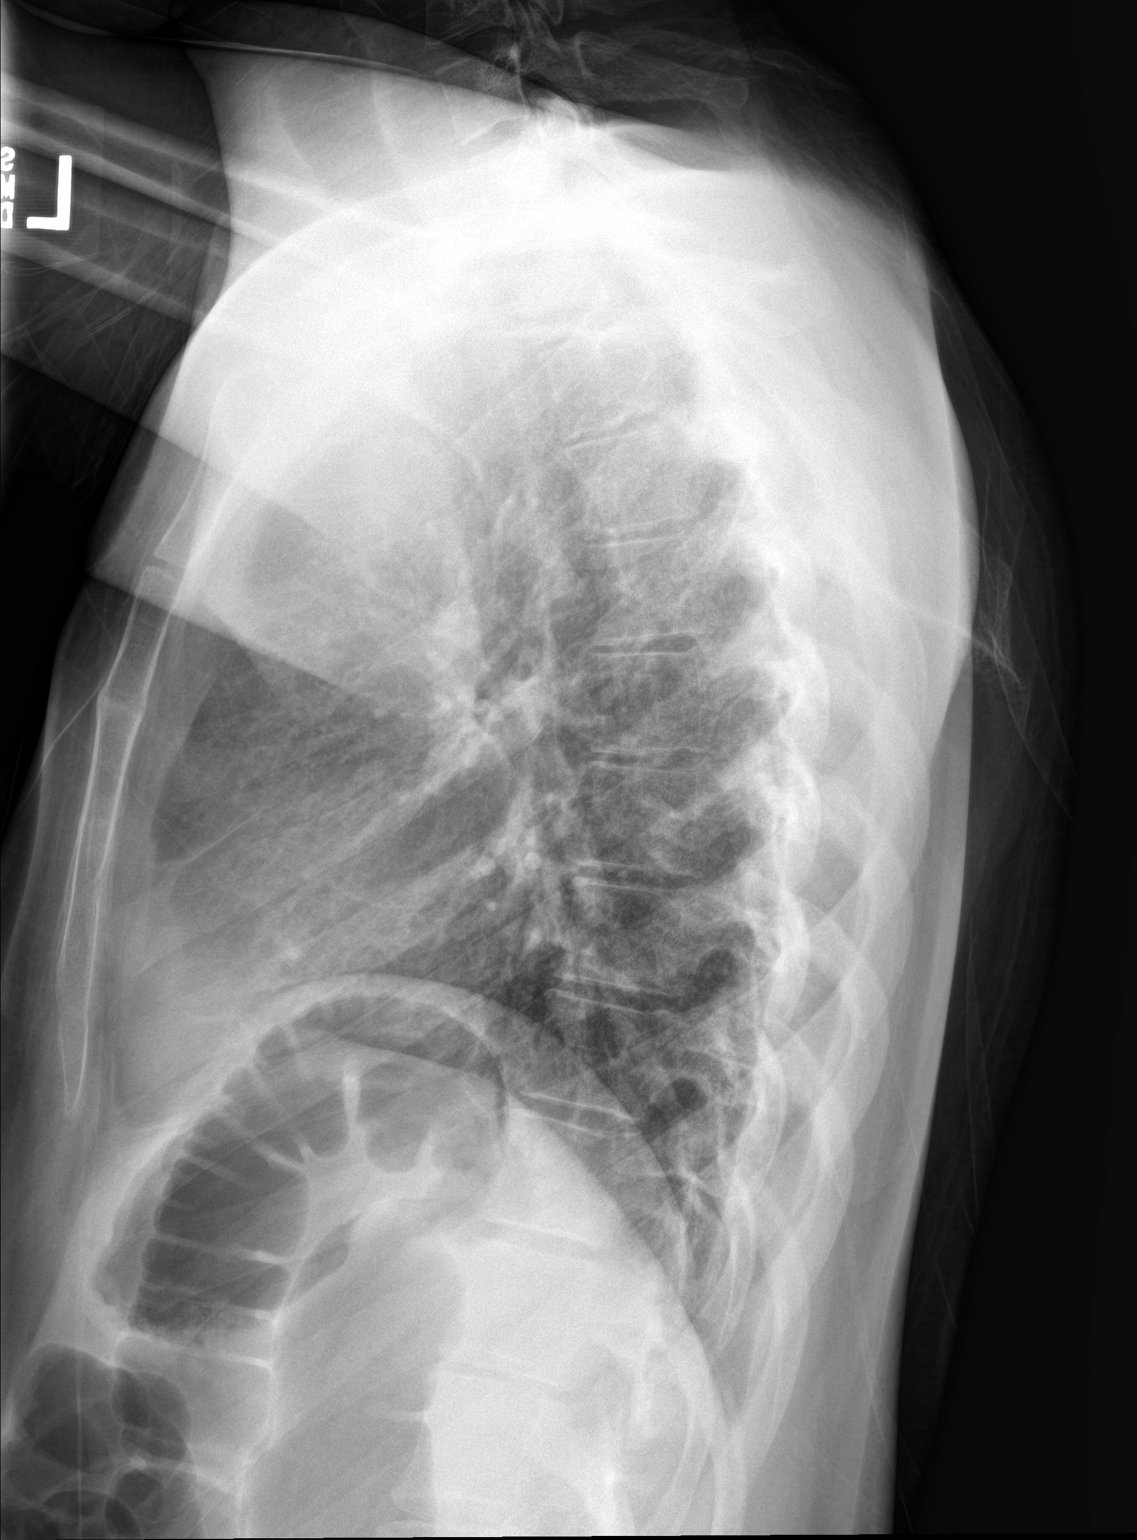

[chest ap]
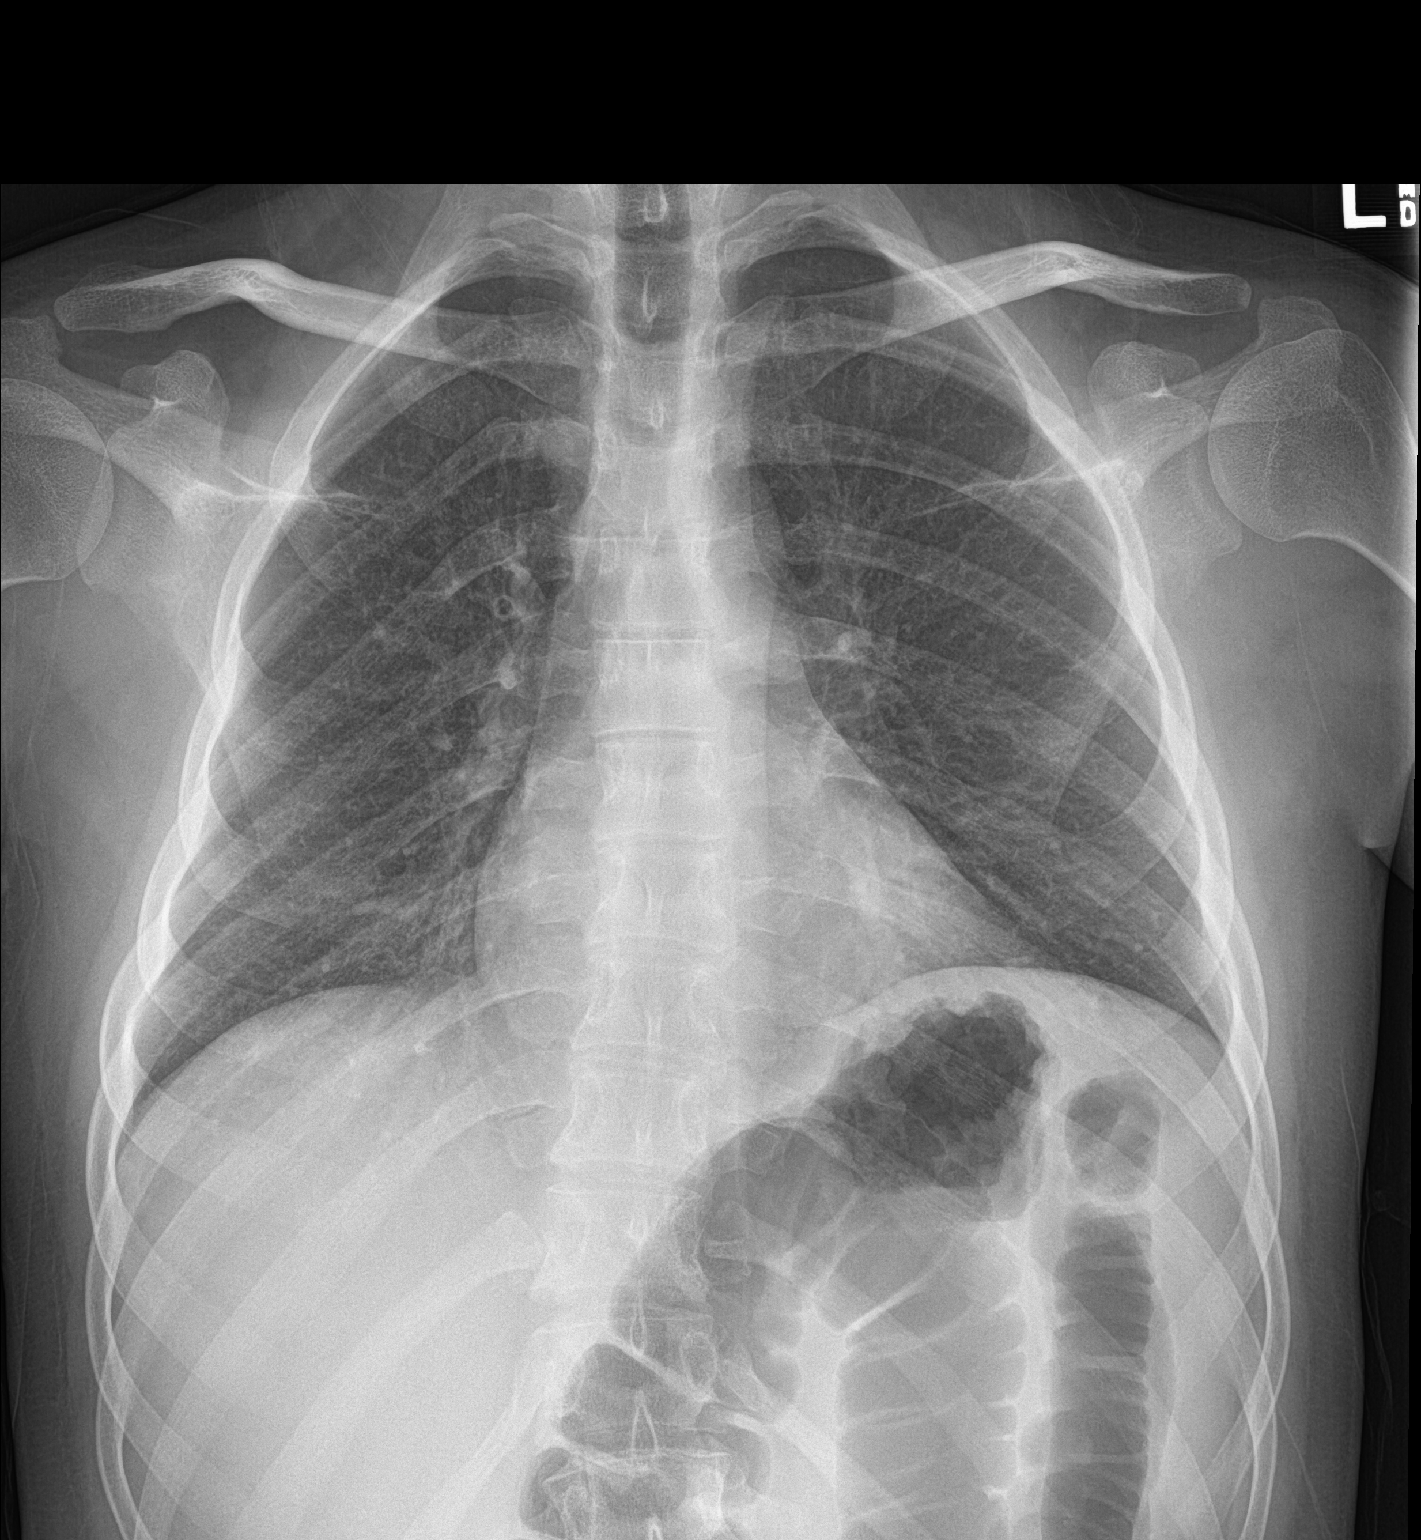

[2 of 2 positions shown; findings below may reference images not displayed]

FINDINGS: Heart size and mediastinal contours are within normal limits. Lungs
appear stable. No confluent opacity to suggest a developing
pneumonia. No pleural effusion or pneumothorax seen. No acute or
suspicious osseous finding.
IMPRESSION: No active cardiopulmonary disease. No evidence of aspiration or
infectious pneumonia.

## 2021-03-09 ENCOUNTER — Ambulatory Visit
Admission: EM | Admit: 2021-03-09 | Discharge: 2021-03-09 | Disposition: A | Discharge status: Home / Self Care | Payer: BC Managed Care – PPO | Attending: Internal Medicine | Admitting: Internal Medicine

## 2021-03-09 ENCOUNTER — Other Ambulatory Visit: Payer: Self-pay

## 2021-03-09 DIAGNOSIS — J069 Acute upper respiratory infection, unspecified: Secondary | ICD-10-CM | POA: Diagnosis present

## 2021-03-09 DIAGNOSIS — J189 Pneumonia, unspecified organism: Secondary | ICD-10-CM | POA: Insufficient documentation

## 2021-03-09 DIAGNOSIS — J45909 Unspecified asthma, uncomplicated: Secondary | ICD-10-CM | POA: Insufficient documentation

## 2021-03-09 DIAGNOSIS — G809 Cerebral palsy, unspecified: Secondary | ICD-10-CM | POA: Insufficient documentation

## 2021-03-09 DIAGNOSIS — Q043 Other reduction deformities of brain: Secondary | ICD-10-CM | POA: Insufficient documentation

## 2021-03-09 LAB — RAPID INFLUENZA A&B ANTIGENS
Influenza A (ARMC): NEGATIVE
Influenza B (ARMC): NEGATIVE

## 2021-03-09 LAB — GROUP A STREP BY PCR: Group A Strep by PCR: NOT DETECTED

## 2021-03-09 NOTE — Discharge Instructions (Signed)
Continue with nebs as needed

## 2021-03-09 NOTE — ED Provider Notes (Signed)
[facility]    CSN: [id number] Arrival date & time: 03/09/21  1956      History   Chief Complaint Chief Complaint  Patient presents with  . Cough    HPI Corey Robertson is a 25 y.o. male who presents with onset of no appetite since yesterday. Has been drooling more than his usual. Has rhinitis and chest congestion. Mother gave him his neb treatments this pm. Started getting low grade temp of highest to 99.5 this pm. Mother did a covid at home test and was neg.     Past Medical History:  Diagnosis Date  . Cerebral palsy (HCC)    Left arm and leg most effected  . Developmental non-verbal disorder   . Reactive airway disease    with bad colds  . Seizures (HCC)    none in 12 yrs. controlled on carbamazepine  . Shortness of breath dyspnea     Patient Active Problem List   Diagnosis Date Noted  . Cerebral palsy (HCC) 03/09/2021  . 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 Reactive airway disease 03/09/2021  . Recurrent pneumonia 03/09/2021  . Unspecified convulsions (HCC) 09/12/2017  . Aspiration pneumonia (HCC) 11/27/2016  . Sepsis (HCC) 11/27/2016  . GERD (gastroesophageal reflux disease) 11/27/2016  . History of traumatic brain injury 11/27/2016  . Nausea & vomiting 11/27/2016    Past Surgical History:  Procedure Laterality Date  . ADENOIDECTOMY    . ESOPHAGOGASTRODUODENOSCOPY    . ROOT CANAL    . TOENAIL EXCISION Bilateral 08/20/2015   Procedure: MINOR TOENAIL EXCISION AND MATRIX PERMANENT BILATERAL BORDERS BILATERAL GREAT TOES ;  Surgeon: Gwyneth Revels, DPM;  Location: [facility];  Service: Podiatry;  Laterality: Bilateral;  PHENOL PENROSE DRAINS X 2  USED AS TOURNIQUETS.Marland KitchenMarland KitchenAPPLIED AT 1230 AND OFF AT 1245  . TONSILLECTOMY    . TYMPANOSTOMY TUBE PLACEMENT     x7       Home Medications    Prior to Admission medications   Medication Sig Start Date End Date Taking? Authorizing Provider  albuterol (PROVENTIL HFA;VENTOLIN HFA) 108 (90 BASE)  MCG/ACT inhaler Inhale 2 puffs into the lungs every 6 (six) hours as needed for wheezing or shortness of breath.   Yes [provider]  budesonide (PULMICORT) 0.5 MG/2ML nebulizer solution Take 0.5 mg by nebulization 2 (two) times daily as needed.   Yes [provider]  carbamazepine (CARBATROL) 200 MG 12 hr capsule Take 200 mg by mouth 2 (two) times daily. 2 caps in AM. 3 caps in PM.   Yes [provider]  fluticasone (FLONASE) 50 MCG/ACT nasal spray  12/12/18  Yes [provider]  Multiple Vitamins-Minerals (MULTIVITAMIN WITH IRON-MINERALS) liquid Take 5 mLs by mouth daily.   Yes [provider]  NEXIUM 40 MG packet Take 40 mg by mouth 2 (two) times daily. 11/10/16  Yes [provider]  Nutritional Supplements (BOOST PLUS PO) Take 8 oz by mouth 3 (three) times daily.   Yes [provider]    Family History Family History  Problem Relation Age of Onset  . Hypertension Mother   . Hypertension Maternal Grandfather     Social History Social History   Tobacco Use  . Smoking status: Never Smoker  . Smokeless tobacco: Never Used  Vaping Use  . Vaping Use: Never used  Substance Use Topics  . Alcohol use: No  . Drug use: No     Allergies   Azithromycin and Omeprazole   Review of Systems Review of  Systems  Constitutional: Positive for appetite change and fever. Negative for activity change.  HENT: Positive for congestion, drooling and rhinorrhea. Negative for mouth sores.   Eyes: Negative for discharge.  Respiratory: Positive for cough and wheezing.   Gastrointestinal: Negative for diarrhea, nausea and vomiting.  Musculoskeletal: Negative for gait problem.  Skin: Negative for rash.  Hematological: Negative for adenopathy.     Physical Exam Triage Vital Signs ED Triage Vitals  Enc Vitals Group     BP 03/09/21 2013 111/83     Pulse Rate 03/09/21 2013 (!) 130     Resp 03/09/21 2013 18     Temp 03/09/21 2013 98.5  F (36.9 C)     Temp Source 03/09/21 2013 Axillary     SpO2 03/09/21 2013 98 %     Weight 03/09/21 2011 140 lb (63.5 kg)     Height 03/09/21 2011 5\' 5"  (1.651 m)     Head Circumference --      Peak Flow --      Pain Score 03/09/21 2011 0     Pain Loc --      Pain Edu? --      Excl. in GC? --    No data found.  Updated Vital Signs BP 111/83 (BP Location: Left Arm)   Pulse (!) 130   Temp 98.5 F (36.9 C) (Axillary)   Resp 18   Ht 5\' 5"  (1.651 m)   Wt 140 lb (63.5 kg)   SpO2 98%   BMI 23.30 kg/m   Visual Acuity Right Eye Distance:   Left Eye Distance:   Bilateral Distance:    Right Eye Near:   Left Eye Near:    Bilateral Near:     Physical Exam Vitals and nursing note reviewed.  Constitutional:      General: He is not in acute distress. HENT:     Head: Normocephalic.     Right Ear: Tympanic membrane, ear canal and external ear normal.     Left Ear: Tympanic membrane, ear canal and external ear normal.     Nose: Congestion and rhinorrhea present.     Mouth/Throat:     Mouth: Mucous membranes are moist.     Pharynx: Oropharynx is clear.     Comments: Drooling a lot Eyes:     General: No scleral icterus.    Conjunctiva/sclera: Conjunctivae normal.  Cardiovascular:     Rate and Rhythm: Normal rate and regular rhythm.  Pulmonary:     Effort: Pulmonary effort is normal.     Breath sounds: Rhonchi present. No wheezing or rales.     Comments: Rhonchi resolved after coughing Musculoskeletal:     Cervical back: Neck supple.  Lymphadenopathy:     Cervical: No cervical adenopathy.  Skin:    General: Skin is warm and dry.     Findings: No rash.  Neurological:     Mental Status: He is alert and oriented to person, place, and time.     Gait: Gait normal.  Psychiatric:        Mood and Affect: Mood normal.        Behavior: Behavior normal.        Thought Content: Thought content normal.        Judgment: Judgment normal.      UC Treatments / Results   Labs (all labs ordered are listed, but only abnormal results are displayed) Labs Reviewed  GROUP A STREP BY PCR  RAPID INFLUENZA A&B ANTIGENS  Neg strep and flu tests  EKG   Radiology No results found.  Procedures Procedures (including critical care time)  Medications Ordered in UC Medications - No data to display  Initial Impression / Assessment and Plan / UC Course  I have reviewed the triage vital signs and the nursing notes. Pertinent labs results that were available during my care of the patient were reviewed by me and considered in my medical decision making (see chart for details). Viral URI.Advised supportive care prn   Final Clinical Impressions(s) / UC Diagnoses   Final diagnoses:  Viral URI with cough     Discharge Instructions     Continue with nebs as needed     ED Prescriptions    None     PDMP not reviewed this encounter.   Garey Ham, Cordelia Poche 03/09/21 2134

## 2021-03-09 NOTE — ED Triage Notes (Addendum)
Pt presents with mom and c/o decreased appetite since yesterday, cough and some possible wheezing. Mom has given him pulmicort and albuterol today with some improvement. Mom also reports elevated temps today (99.5).

## 2023-05-10 ENCOUNTER — Ambulatory Visit
Admission: EM | Admit: 2023-05-10 | Discharge: 2023-05-10 | Disposition: A | Discharge status: Home / Self Care | Payer: Managed Care, Other (non HMO) | Attending: Emergency Medicine | Admitting: Emergency Medicine

## 2023-05-10 DIAGNOSIS — U071 COVID-19: Secondary | ICD-10-CM | POA: Insufficient documentation

## 2023-05-10 LAB — GROUP A STREP BY PCR: Group A Strep by PCR: NOT DETECTED

## 2023-05-10 LAB — SARS CORONAVIRUS 2 BY RT PCR: SARS Coronavirus 2 by RT PCR: POSITIVE — AB

## 2023-05-10 MED ORDER — ONDANSETRON 8 MG PO TBDP
8.0000 mg | ORAL_TABLET | Freq: Once | ORAL | Status: AC
  Administered 2023-05-10: 8 mg via ORAL

## 2023-05-10 MED ORDER — MOLNUPIRAVIR EUA 200MG CAPSULE: 4.0000 | ORAL_CAPSULE | Freq: Two times a day (BID) | ORAL | 0 refills | Status: AC

## 2023-05-10 MED ORDER — ONDANSETRON 8 MG PO TBDP: 8.0000 mg | ORAL_TABLET | Freq: Three times a day (TID) | ORAL | 0 refills | Status: AC | PRN

## 2023-05-10 NOTE — ED Provider Notes (Signed)
[facility]    CSN: [id number] Arrival date & time: 05/10/23  1836      History   Chief Complaint Chief Complaint  Patient presents with   Emesis    HPI Corey Robertson is a 27 y.o. male.   HPI  27 year old male with a past medical history significant for cerebral palsy, aspiration pneumonia, sepsis, and seizure disorder presents for evaluation of vomiting that started yesterday.  He was at camp last week and multiple campers came down with a stomach bug.  Mom reports that he had 1 episode of vomiting yesterday and he has not had any more.  She has not been able to get him to eat or drink and he has not taken any of his medications, including his seizure medications.  Mom is concerned because he has missed 2 doses.  He is also had a fever with a Tmax of 100.7 today.  He does indicate that he is been experiencing a sore throat.  Mom denies any runny nose, nasal congestion, diarrhea, or complaints of abdominal pain.  Patient's father did have COVID 2 weeks ago and mom and patient have been testing at home and have had 4 negative home self test.  Last test was 2 days ago.  Past Medical History:  Diagnosis Date   Cerebral palsy (HCC)    Left arm and leg most effected   Developmental non-verbal disorder    Reactive airway disease    with bad colds   Seizures (HCC)    none in 12 yrs. 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   Reactive airway disease 03/09/2021   Recurrent pneumonia 03/09/2021   Unspecified convulsions (HCC) 09/12/2017   Aspiration pneumonia (HCC) 11/27/2016   Sepsis (HCC) 11/27/2016   GERD (gastroesophageal reflux disease) 11/27/2016   History of traumatic brain injury 11/27/2016   Nausea & vomiting 11/27/2016    Past Surgical History:  Procedure Laterality Date   ADENOIDECTOMY      ESOPHAGOGASTRODUODENOSCOPY     ROOT CANAL     TOENAIL EXCISION Bilateral 08/20/2015   Procedure: MINOR TOENAIL EXCISION AND MATRIX PERMANENT BILATERAL BORDERS BILATERAL GREAT TOES ;  Surgeon: Gwyneth Revels, DPM;  Location: [facility];  Service: Podiatry;  Laterality: Bilateral;  PHENOL PENROSE DRAINS X 2  USED AS TOURNIQUETS.Marland KitchenMarland KitchenAPPLIED AT 1230 AND OFF AT 1245   TONSILLECTOMY     TYMPANOSTOMY TUBE PLACEMENT     x7       Home Medications    Prior to Admission medications   Medication Sig Start Date End Date Taking? Authorizing Provider  albuterol (PROVENTIL HFA;VENTOLIN HFA) 108 (90 BASE) MCG/ACT inhaler Inhale 2 puffs into the lungs every 6 (six) hours as needed for wheezing or shortness of breath.   Yes [provider]  budesonide (PULMICORT) 0.5 MG/2ML nebulizer solution Take 0.5 mg by nebulization 2 (two) times daily as needed.   Yes [provider]  carbamazepine (CARBATROL) 200 MG 12 hr capsule Take 200 mg by mouth 2 (two) times daily. 2 caps in AM. 3 caps in PM.   Yes [provider]  fluticasone (FLONASE) 50 MCG/ACT nasal spray  12/12/18  Yes [provider]  molnupiravir EUA (LAGEVRIO) 200 mg CAPS capsule Take 4 capsules (800 mg total) by mouth 2 (two) times daily for 5 days. 05/10/23 05/15/23 Yes Becky Augusta, NP  Multiple Vitamins-Minerals (MULTIVITAMIN WITH IRON-MINERALS) liquid Take 5 mLs by mouth daily.   Yes [provider]  NEXIUM 40 MG packet Take 40 mg by mouth 2 (two) times daily. 11/10/16  Yes [provider]  Nutritional Supplements (BOOST PLUS PO) Take 8 oz by mouth 3 (three) times daily.   Yes [provider]  ondansetron (ZOFRAN-ODT) 8 MG disintegrating tablet Take 1 tablet (8 mg total) by mouth every 8 (eight) hours as needed for nausea or vomiting. 05/10/23  Yes Becky Augusta, NP    Family History Family History  Problem Relation Age of Onset   Hypertension Mother    Hypertension Maternal  Grandfather     Social History Social History   Tobacco Use   Smoking status: Never   Smokeless tobacco: Never  Vaping Use   Vaping status: Never Used  Substance Use Topics   Alcohol use: No   Drug use: No     Allergies   Omeprazole and Azithromycin   Review of Systems Review of Systems  Constitutional:  Positive for fever.  HENT:  Positive for sore throat. Negative for congestion and rhinorrhea.   Respiratory:  Negative for cough.   Gastrointestinal:  Positive for vomiting. Negative for diarrhea.     Physical Exam Triage Vital Signs ED Triage Vitals  Encounter Vitals Group     BP      Systolic BP Percentile      Diastolic BP Percentile      Pulse      Resp      Temp      Temp src      SpO2      Weight      Height      Head Circumference      Peak Flow      Pain Score      Pain Loc      Pain Education      Exclude from Growth Chart    No data found.  Updated Vital Signs BP 122/88 (BP Location: Right Arm)   Pulse (!) 138   Temp 98.1 F (36.7 C) (Axillary)   SpO2 95%   Visual Acuity Right Eye Distance:   Left Eye Distance:   Bilateral Distance:    Right Eye Near:   Left Eye Near:    Bilateral Near:     Physical Exam Vitals and nursing note reviewed.  Constitutional:      Appearance: Normal appearance. He is not ill-appearing.  HENT:     Head: Normocephalic and atraumatic.     Mouth/Throat:     Mouth: Mucous membranes are moist.     Pharynx: Oropharynx is clear. Posterior oropharyngeal erythema present. No oropharyngeal exudate.     Comments: Patient's bilateral tonsillar pillars are mildly erythematous but free of exudate. Abdominal:     General: Abdomen is flat.     Palpations: Abdomen is soft.     Tenderness: There is abdominal tenderness. There is no guarding or rebound.     Comments: Patient acknowledged epigastric tenderness with palpation but then when questioned a second time he was noncommittal.  No guarding or rebound noted.   Musculoskeletal:     Cervical back: Normal range of motion and neck supple.  Lymphadenopathy:     Cervical: No cervical adenopathy.  Skin:    General: Skin is warm and dry.     Capillary Refill: Capillary refill takes less than 2 seconds.  Neurological:     Mental Status: He is  alert.      UC Treatments / Results  Labs (all labs ordered are listed, but only abnormal results are displayed) Labs Reviewed  SARS CORONAVIRUS 2 BY RT PCR - Abnormal; Notable for the following components:      Result Value   SARS Coronavirus 2 by RT PCR POSITIVE (*)    All other components within normal limits  GROUP A STREP BY PCR    EKG   Radiology No results found.  Procedures Procedures (including critical care time)  Medications Ordered in UC Medications  ondansetron (ZOFRAN-ODT) disintegrating tablet 8 mg (8 mg Oral Given 05/10/23 1906)    Initial Impression / Assessment and Plan / UC Course  I have reviewed the triage vital signs and the nursing notes.  Pertinent labs & imaging results that were available during my care of the patient were reviewed by me and considered in my medical decision making (see chart for details).  Patient is a pleasant 27 year old patient with cerebral palsy who is nonverbal presenting for evaluation of fever, sore throat, and a single episode of vomiting that started yesterday.  Mom brought him in for evaluation because he spiked a fever of 100.7 at home and she is concerned because he has not had anything to drink and he has not taken any of his medications today.  He has missed 2 doses of seizure medication.  Patient does indicate when asked that his throat is sore but he initially denied any abdominal pain.  However, during palpation he did initially complain of pain in the epigastrium but then when asked a second time he did not acknowledge any pain.  He has no guarding or rebound.  Visualization of his posterior oropharynx was accomplished using a tongue  blade and penlight.  He does have mild erythema to bilateral tonsillar pillars but no appreciable exudate.  No anterior cervical lymphadenopathy present on exam.  I will order a strep PCR given the patient's fever, vomiting, and complaints of sore throat as well as a COVID PCR given his recent exposure.  I will have staff administer 8 mg of ODT Zofran and we will attempt a p.o. challenge with the patient.  If the patient still refuses to drink I have advised mom that she will need to take him to the emergency department where he can have IV hydration as well as receive an IV dose of his seizure medication to prevent seizures.  Has been on for approximately 20 minutes.  I have given mom some water and a metal cup to attempt a p.o. challenge with the patient.  COVID PCR is positive.  Strep PCR is negative.  Mom reports that following Zofran ministration he is taking water and she thinks she will to get him to take medications at home.  I will discharge him home with a prescription for Zofran.  Given that patient is on carbamazepine and this will render Paxlovid ineffective I will prescribe molnupiravir twice a day for 5 days instead.   Final Clinical Impressions(s) / UC Diagnoses   Final diagnoses:  COVID-19     Discharge Instructions      You have been diagnosed with COVID-19 tonight.  Take the molnupiravir twice daily for 5 days for treatment of COVID-19.  Use over-the-counter Tylenol and or ibuprofen according to the package instructions as needed for fever or pain.  Use the Zofran every 8 hours as needed for nausea and vomiting.  Continue to encourage oral fluid intake to help maintain hydration.  If Ra stops drinking, develops a fever not taken care of by Tylenol or ibuprofen, or develops any increased work of breathing or blowing of his lips you need to take him to the ER for evaluation.     ED Prescriptions     Medication Sig Dispense Auth. Provider   ondansetron  (ZOFRAN-ODT) 8 MG disintegrating tablet Take 1 tablet (8 mg total) by mouth every 8 (eight) hours as needed for nausea or vomiting. 20 tablet Becky Augusta, NP   molnupiravir EUA (LAGEVRIO) 200 mg CAPS capsule Take 4 capsules (800 mg total) by mouth 2 (two) times daily for 5 days. 40 capsule Becky Augusta, NP      PDMP not reviewed this encounter.   Becky Augusta, NP 05/10/23 1939

## 2023-05-10 NOTE — ED Triage Notes (Signed)
Pt presents to UC c/o emesis onset yesterday. Mother states patient was at camp last week and a lot of the people came down with stomach bugs.

## 2023-05-10 NOTE — Discharge Instructions (Addendum)
You have been diagnosed with COVID-19 tonight.  Take the molnupiravir twice daily for 5 days for treatment of COVID-19.  Use over-the-counter Tylenol and or ibuprofen according to the package instructions as needed for fever or pain.  Use the Zofran every 8 hours as needed for nausea and vomiting.  Continue to encourage oral fluid intake to help maintain hydration.  If Corey Robertson stops drinking, develops a fever not taken care of by Tylenol or ibuprofen, or develops any increased work of breathing or blowing of his lips you need to take him to the ER for evaluation.

## 2024-03-13 ENCOUNTER — Other Ambulatory Visit: Payer: Self-pay | Admitting: Family Medicine

## 2024-03-13 ENCOUNTER — Ambulatory Visit
Admission: RE | Admit: 2024-03-13 | Discharge: 2024-03-13 | Disposition: A | Discharge status: Home / Self Care | Source: Ambulatory Visit | Attending: Family Medicine | Admitting: Family Medicine

## 2024-03-13 ENCOUNTER — Ambulatory Visit
Admission: RE | Admit: 2024-03-13 | Discharge: 2024-03-13 | Disposition: A | Discharge status: Home / Self Care | Attending: Family Medicine | Admitting: Family Medicine

## 2024-03-13 DIAGNOSIS — R0989 Other specified symptoms and signs involving the circulatory and respiratory systems: Secondary | ICD-10-CM
